# Patient Record
Sex: Female | Born: 1955 | Race: White | Hispanic: No | Marital: Married | Smoking: Never smoker
Health system: Southern US, Community
[De-identification: ages and names within clinical notes are randomized; demographics above are authoritative.]

## PROBLEM LIST (undated history)

## (undated) HISTORY — PX: OTHER SURGICAL HISTORY: SHX169

## (undated) HISTORY — PX: TONSILLECTOMY: SUR1361

---

## 1997-12-12 ENCOUNTER — Ambulatory Visit (HOSPITAL_COMMUNITY): Admission: RE | Admit: 1997-12-12 | Discharge: 1997-12-12 | Payer: Self-pay | Admitting: Obstetrics & Gynecology

## 1997-12-12 ENCOUNTER — Other Ambulatory Visit: Admission: RE | Admit: 1997-12-12 | Discharge: 1997-12-12 | Payer: Self-pay | Admitting: Obstetrics & Gynecology

## 1998-12-18 ENCOUNTER — Other Ambulatory Visit: Admission: RE | Admit: 1998-12-18 | Discharge: 1998-12-18 | Payer: Self-pay | Admitting: Obstetrics & Gynecology

## 2000-05-18 ENCOUNTER — Other Ambulatory Visit: Admission: RE | Admit: 2000-05-18 | Discharge: 2000-05-18 | Payer: Self-pay | Admitting: Obstetrics and Gynecology

## 2001-06-08 ENCOUNTER — Other Ambulatory Visit: Admission: RE | Admit: 2001-06-08 | Discharge: 2001-06-08 | Payer: Self-pay | Admitting: Obstetrics and Gynecology

## 2002-06-09 ENCOUNTER — Other Ambulatory Visit: Admission: RE | Admit: 2002-06-09 | Discharge: 2002-06-09 | Payer: Self-pay | Admitting: Obstetrics and Gynecology

## 2003-06-28 ENCOUNTER — Other Ambulatory Visit: Admission: RE | Admit: 2003-06-28 | Discharge: 2003-06-28 | Payer: Self-pay | Admitting: Obstetrics and Gynecology

## 2004-08-01 ENCOUNTER — Other Ambulatory Visit: Admission: RE | Admit: 2004-08-01 | Discharge: 2004-08-01 | Payer: Self-pay | Admitting: Obstetrics and Gynecology

## 2005-09-08 ENCOUNTER — Other Ambulatory Visit: Admission: RE | Admit: 2005-09-08 | Discharge: 2005-09-08 | Payer: Self-pay | Admitting: Obstetrics and Gynecology

## 2008-03-07 HISTORY — PX: COLONOSCOPY: SHX174

## 2008-03-21 ENCOUNTER — Ambulatory Visit: Payer: Self-pay | Admitting: Internal Medicine

## 2008-04-03 ENCOUNTER — Ambulatory Visit: Payer: Self-pay | Admitting: Internal Medicine

## 2015-11-22 ENCOUNTER — Other Ambulatory Visit: Payer: Self-pay | Admitting: Obstetrics and Gynecology

## 2015-11-22 DIAGNOSIS — R928 Other abnormal and inconclusive findings on diagnostic imaging of breast: Secondary | ICD-10-CM

## 2015-11-23 ENCOUNTER — Ambulatory Visit
Admission: RE | Admit: 2015-11-23 | Discharge: 2015-11-23 | Disposition: A | Discharge status: Home / Self Care | Payer: Managed Care, Other (non HMO) | Source: Ambulatory Visit | Attending: Obstetrics and Gynecology | Admitting: Obstetrics and Gynecology

## 2015-11-23 DIAGNOSIS — R928 Other abnormal and inconclusive findings on diagnostic imaging of breast: Secondary | ICD-10-CM

## 2015-11-23 IMAGING — MG MM DIAG BREAST TOMO UNI LEFT
6 series · 6 of 14 positions shown · non-contrast
Comparison: Previous exam(s).

CLINICAL DATA: Patient called back from screening for possible left
breast distortion.

EXAM:
2D DIGITAL DIAGNOSTIC UNILATERAL LEFT MAMMOGRAM WITH CAD AND ADJUNCT
TOMO

[L CC synth-2D]
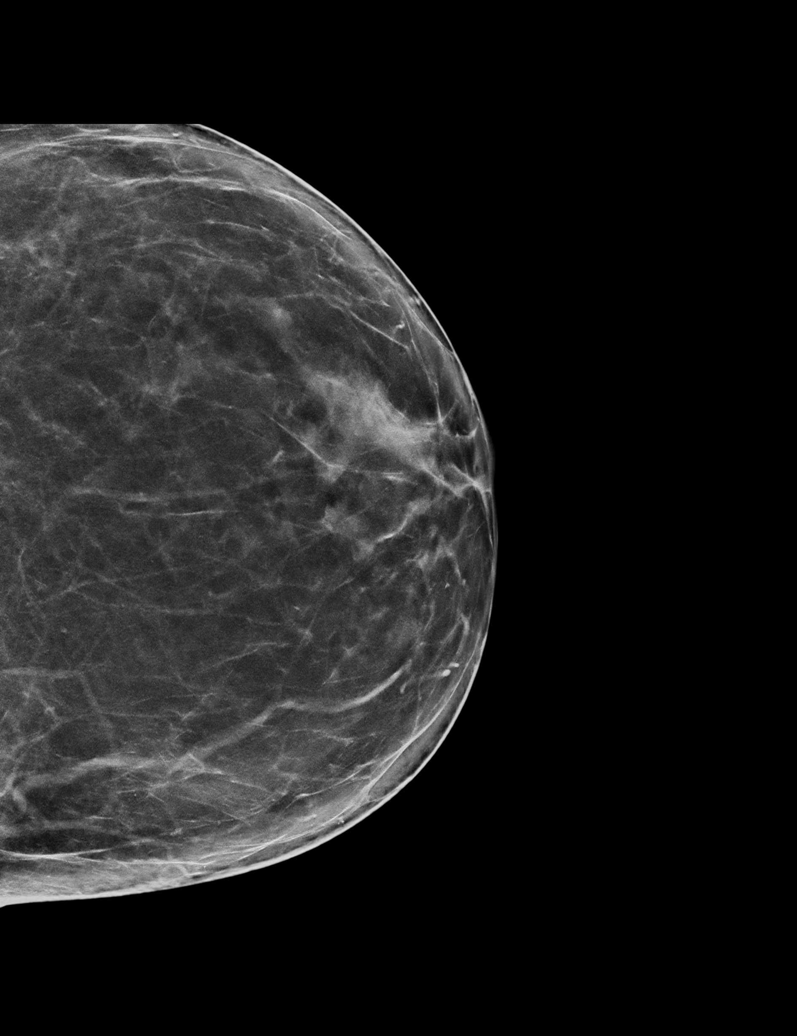

[L CC]
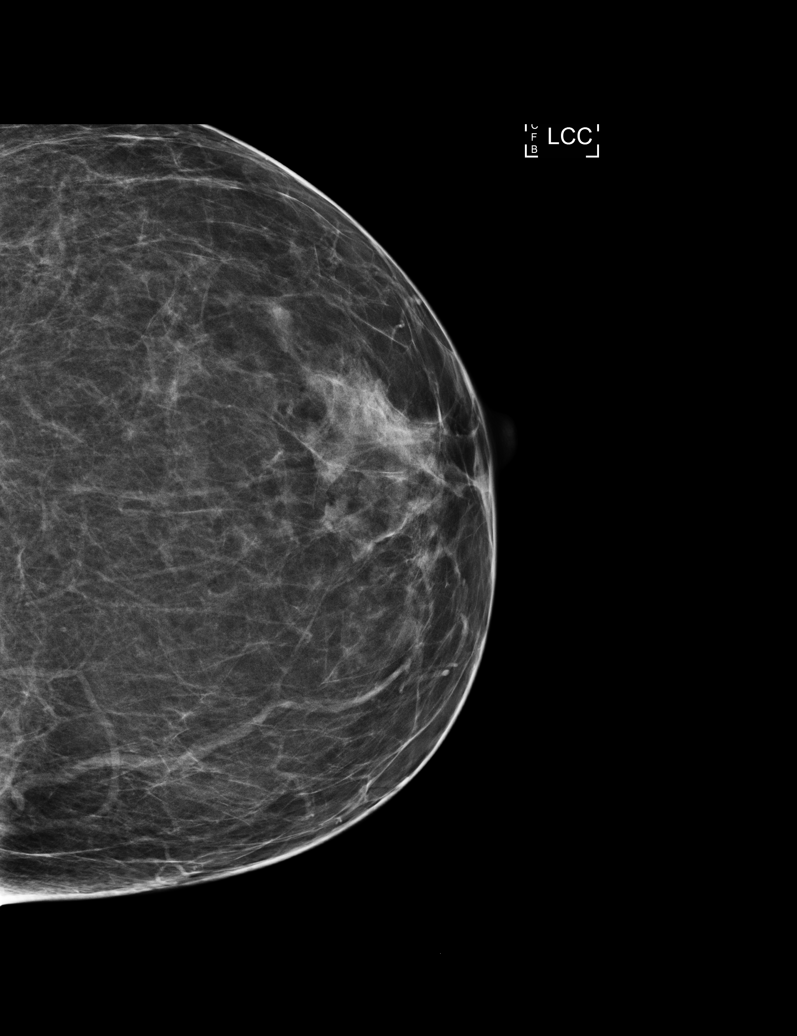

[L MLO]
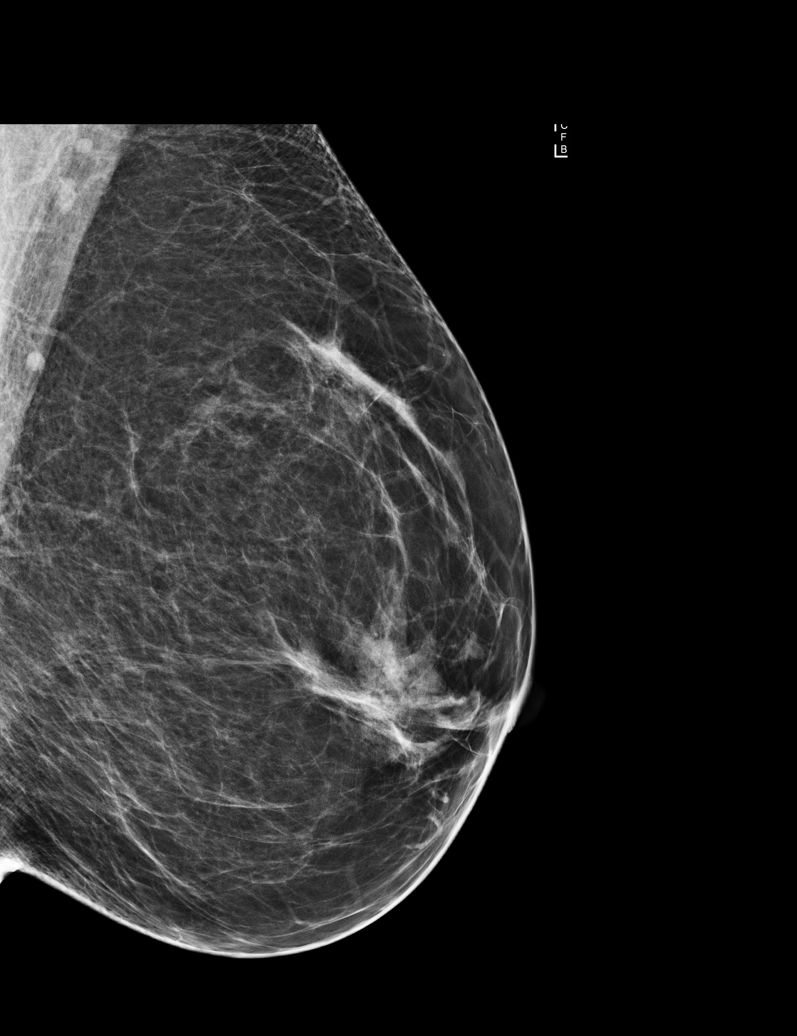

[L MLO synth-2D]
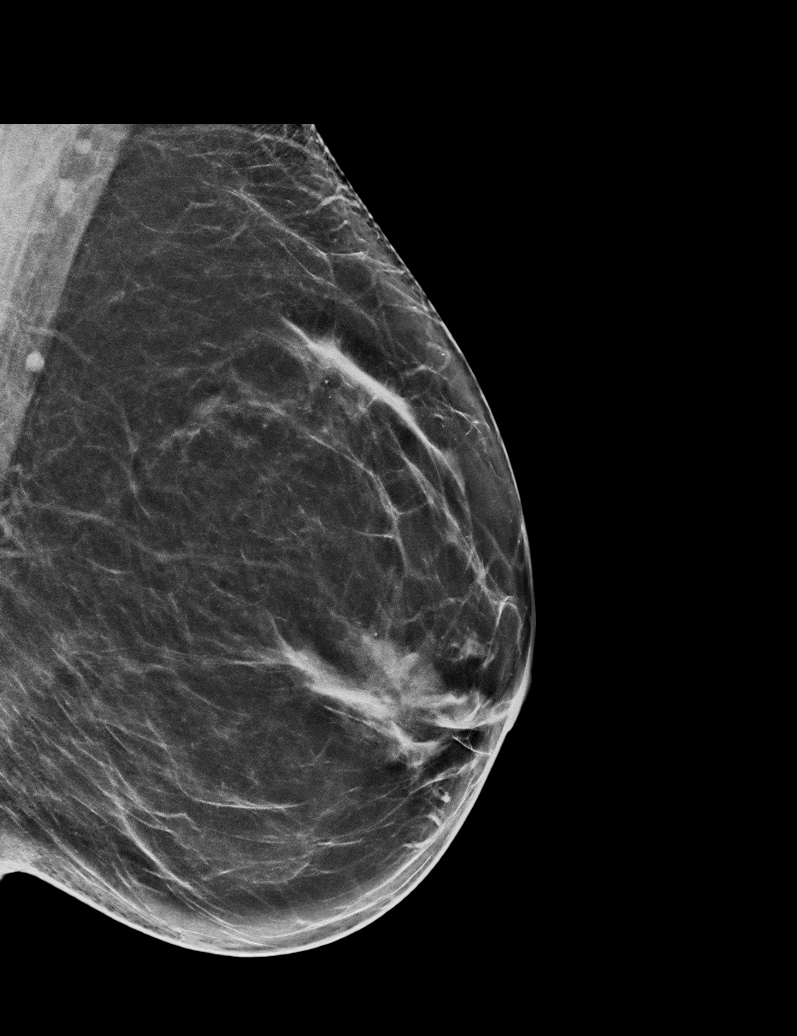

[L MLO tomo · tomo slice 37/73.0]
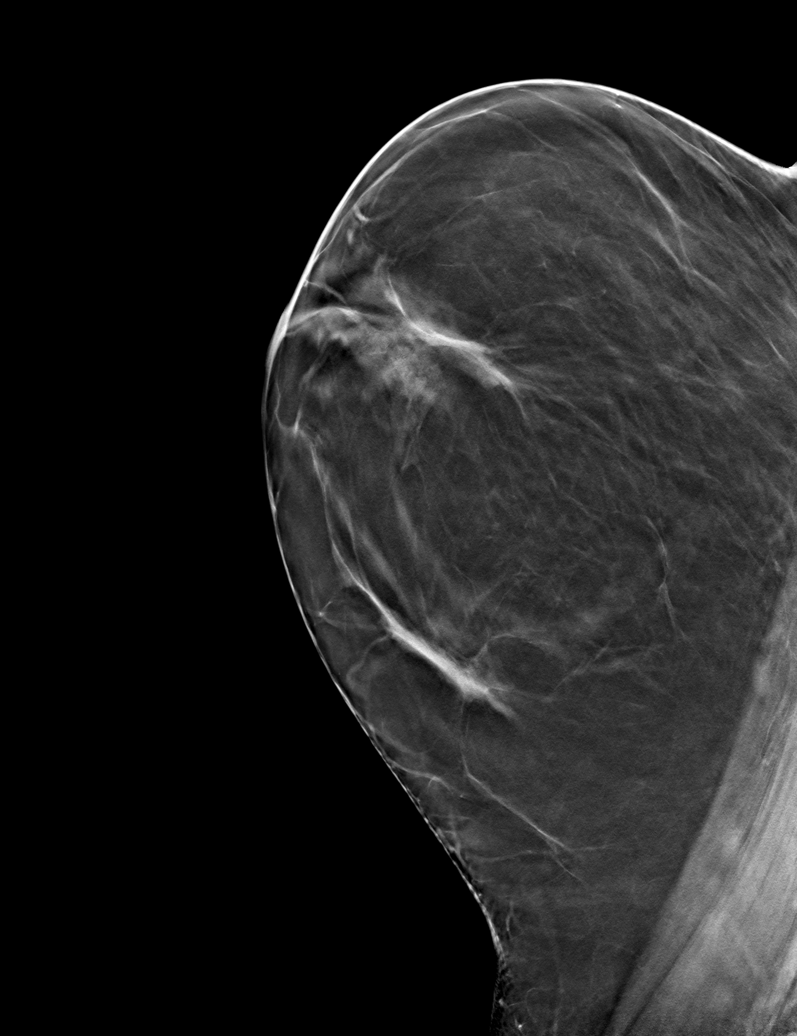

[L CC tomo · tomo slice 36/71.0]
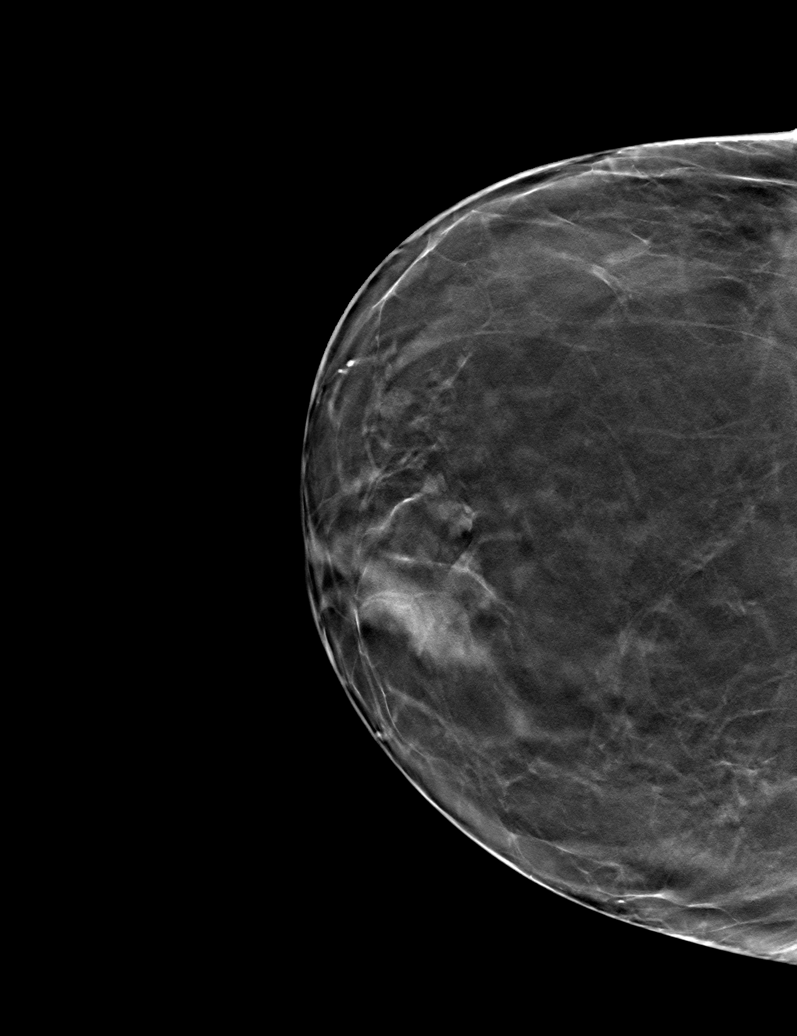

[6 of 14 positions shown; findings below may reference images not displayed]

ACR Breast Density Category c: The breast tissue is heterogeneously
dense, which may obscure small masses.
FINDINGS: CC and MLO tomosynthesis images of the left breast were obtained.
The questioned distortion within the upper-outer left breast
resolved with additional imaging, compatible with overlapping
fibroglandular breast tissue.

Mammographic images were processed with CAD.
IMPRESSION: No mammographic evidence for malignancy.

RECOMMENDATION:
Screening mammogram in one year.(Code:[IP])

I have discussed the findings and recommendations with the patient.
Results were also provided in writing at the conclusion of the
visit. If applicable, a reminder letter will be sent to the patient
regarding the next appointment.

BI-RADS CATEGORY  1: Negative.

## 2015-11-26 ENCOUNTER — Other Ambulatory Visit: Payer: Self-pay

## 2017-12-16 ENCOUNTER — Other Ambulatory Visit: Payer: Self-pay

## 2017-12-16 DIAGNOSIS — Z13220 Encounter for screening for lipoid disorders: Secondary | ICD-10-CM

## 2017-12-16 DIAGNOSIS — Z1321 Encounter for screening for nutritional disorder: Secondary | ICD-10-CM

## 2017-12-16 DIAGNOSIS — Z0000 Encounter for general adult medical examination without abnormal findings: Secondary | ICD-10-CM

## 2017-12-16 DIAGNOSIS — Z1329 Encounter for screening for other suspected endocrine disorder: Secondary | ICD-10-CM

## 2018-01-04 ENCOUNTER — Other Ambulatory Visit: Payer: 59 | Admitting: Internal Medicine

## 2018-01-04 DIAGNOSIS — Z0000 Encounter for general adult medical examination without abnormal findings: Secondary | ICD-10-CM

## 2018-01-04 DIAGNOSIS — Z1321 Encounter for screening for nutritional disorder: Secondary | ICD-10-CM

## 2018-01-04 DIAGNOSIS — Z13220 Encounter for screening for lipoid disorders: Secondary | ICD-10-CM

## 2018-01-04 DIAGNOSIS — Z1329 Encounter for screening for other suspected endocrine disorder: Secondary | ICD-10-CM

## 2018-01-05 LAB — COMPLETE METABOLIC PANEL WITH GFR
AG RATIO: 2.2 (calc) (ref 1.0–2.5)
ALT: 10 U/L (ref 6–29)
AST: 18 U/L (ref 10–35)
Albumin: 4.1 g/dL (ref 3.6–5.1)
Alkaline phosphatase (APISO): 55 U/L (ref 33–130)
BUN: 11 mg/dL (ref 7–25)
CALCIUM: 9.2 mg/dL (ref 8.6–10.4)
CO2: 29 mmol/L (ref 20–32)
CREATININE: 0.79 mg/dL (ref 0.50–0.99)
Chloride: 107 mmol/L (ref 98–110)
GFR, EST NON AFRICAN AMERICAN: 80 mL/min/{1.73_m2} (ref >=60)
GFR, Est African American: 93 mL/min/{1.73_m2} (ref >=60)
GLUCOSE: 85 mg/dL (ref 65–99)
Globulin: 1.9 g/dL (calc) (ref 1.9–3.7)
POTASSIUM: 4.2 mmol/L (ref 3.5–5.3)
Sodium: 142 mmol/L (ref 135–146)
Total Bilirubin: 0.5 mg/dL (ref 0.2–1.2)
Total Protein: 6 g/dL — ABNORMAL LOW (ref 6.1–8.1)

## 2018-01-05 LAB — CBC WITH DIFFERENTIAL/PLATELET
BASOS PCT: 0.8 %
Basophils Absolute: 29 cells/uL (ref 0–200)
EOS ABS: 79 {cells}/uL (ref 15–500)
Eosinophils Relative: 2.2 %
HCT: 35.6 % (ref 35.0–45.0)
HEMOGLOBIN: 11.3 g/dL — AB (ref 11.7–15.5)
Lymphs Abs: 1148 cells/uL (ref 850–3900)
MCH: 26.5 pg — AB (ref 27.0–33.0)
MCHC: 31.7 g/dL — ABNORMAL LOW (ref 32.0–36.0)
MCV: 83.6 fL (ref 80.0–100.0)
MONOS PCT: 10.3 %
MPV: 10.7 fL (ref 7.5–12.5)
NEUTROS ABS: 1973 {cells}/uL (ref 1500–7800)
Neutrophils Relative %: 54.8 %
Platelets: 264 10*3/uL (ref 140–400)
RBC: 4.26 10*6/uL (ref 3.80–5.10)
RDW: 13.8 % (ref 11.0–15.0)
Total Lymphocyte: 31.9 %
WBC mixed population: 371 cells/uL (ref 200–950)
WBC: 3.6 10*3/uL — ABNORMAL LOW (ref 3.8–10.8)

## 2018-01-05 LAB — LIPID PANEL
CHOL/HDL RATIO: 2.5 (calc) (ref <5.0)
Cholesterol: 186 mg/dL (ref <200)
HDL: 75 mg/dL (ref >50)
LDL Cholesterol (Calc): 97 mg/dL (calc)
NON-HDL CHOLESTEROL (CALC): 111 mg/dL (ref <130)
Triglycerides: 55 mg/dL (ref <150)

## 2018-01-05 LAB — TSH: TSH: 3.24 m[IU]/L (ref 0.40–4.50)

## 2018-01-05 LAB — VITAMIN D 25 HYDROXY (VIT D DEFICIENCY, FRACTURES): Vit D, 25-Hydroxy: 33 ng/mL (ref 30–100)

## 2018-01-12 ENCOUNTER — Encounter: Payer: Self-pay | Admitting: Internal Medicine

## 2018-01-12 ENCOUNTER — Ambulatory Visit (INDEPENDENT_AMBULATORY_CARE_PROVIDER_SITE_OTHER): Payer: 59 | Admitting: Internal Medicine

## 2018-01-12 VITALS — BP 102/80 | HR 62 | Ht 64.5 in | Wt 141.0 lb

## 2018-01-12 DIAGNOSIS — Z87898 Personal history of other specified conditions: Secondary | ICD-10-CM | POA: Diagnosis not present

## 2018-01-12 DIAGNOSIS — F439 Reaction to severe stress, unspecified: Secondary | ICD-10-CM

## 2018-01-12 DIAGNOSIS — Z85828 Personal history of other malignant neoplasm of skin: Secondary | ICD-10-CM | POA: Diagnosis not present

## 2018-01-12 DIAGNOSIS — Z0000 Encounter for general adult medical examination without abnormal findings: Secondary | ICD-10-CM

## 2018-01-12 LAB — POCT URINALYSIS DIPSTICK
Appearance: NORMAL
Bilirubin, UA: NEGATIVE
Blood, UA: NEGATIVE
Glucose, UA: NEGATIVE
KETONES UA: NEGATIVE
LEUKOCYTES UA: NEGATIVE
NITRITE UA: NEGATIVE
ODOR: NORMAL
PROTEIN UA: NEGATIVE
Spec Grav, UA: 1.01 (ref 1.010–1.025)
UROBILINOGEN UA: 0.2 U/dL
pH, UA: 7.5 (ref 5.0–8.0)

## 2018-01-12 NOTE — Progress Notes (Addendum)
Subjective:    Patient ID: Teresa Simmons, female    DOB: 1955/09/17, 62 y.o.   MRN: [id number]  HPI   62 year old Female referred by Lavera Guise presents to the office for the first time today.  GYN physician Is [facility].   Had mammogram January 02, 2017 as well as Pap smear.  Had DEXA scan there Nov 16, 2015 showing mild osteopenia.  Past medical history: Tonsillectomy at age 54  No history of operations  No accidents  No known drug allergies.  2 pregnancies and no miscarriages.  Medications include Caltrate daily and multivitamin with iron.  Social history: She and her husband have lived here for 23 years.  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  He is employed in Occupational psychologist.  She completed 4 years of college.  Does not smoke.  Social alcohol consumption.  Currently her father who is 30 years old is living with her.  He has had a brain malignancy and is developed some dementia.  Patient exercises several times a week by walking and playing pickle ball.  Family history: Father with history of brain cancer and memory loss.  Mother died at age 43 of lung cancer and was a smoker.  2 brothers one age 53 who is recovering alcoholic and another one age 40 who apparently is healthy as far she knows.  One sister age 65 healthy as far she knows.  2 adult sons ages 11 and 25 healthy.  Review of old records from Dr. Virgina Jock indicate patient has had a TSH hovering between 3.5 and 4.7.  History of basal cell carcinoma left cheek status post Mohs surgery and followed by Dr. Ubaldo Glassing.  History of Raynaud's disease.  Had Tdap immunization 2010, pneumococcal 23 October 17, 2008 and herpes zoster October 17, 2008.  Mammogram done by GYN office.  Pap in 2018 was normal.    Review of Systems insomnia, anxiety depression     Objective:   Physical Exam  Constitutional: She is oriented to person, place, and time. She appears well-developed and  well-nourished. No distress.  HENT:  Head: Normocephalic and atraumatic.  Right Ear: External ear normal.  Left Ear: External ear normal.  Mouth/Throat: Oropharynx is clear and moist.  Eyes: Pupils are equal, round, and reactive to light. EOM are normal.  Neck: No JVD present. No thyromegaly present.  Cardiovascular: Normal rate, regular rhythm and normal heart sounds.  No murmur heard. Pulmonary/Chest: Effort normal. No stridor. No respiratory distress. She has no wheezes.  Breasts normal female  Abdominal: Soft. She exhibits no distension and no mass. There is no tenderness. There is no rebound and no guarding. No hernia.  Lymphadenopathy:    She has no cervical adenopathy.  Neurological: She is alert and oriented to person, place, and time. She displays normal reflexes. No cranial nerve deficit. She exhibits normal muscle tone. Coordination normal.  Skin: Skin is warm and dry. She is not diaphoretic. No erythema. No pallor.  Psychiatric: She has a normal mood and affect. Her behavior is normal. Judgment and thought content normal.  Vitals reviewed.         Assessment & Plan:  History of TSH in the 3-4 range.  TSH recently drawn here was 3.24 and will be monitored.  Lipids are normal.  CBC shows white blood cell count 3600, hemoglobin 11.3 g and platelet count 264,000.  CBC done by Dr. Virgina Jock in 2012 shows white blood  cell count 3300, hemoglobin 12.4 g and platelet count 240,000.  She has not been seen by Dr. Virgina Jock since that time.  GYN results only show hemoglobin.  Suggest we recheck this in 6 months.  Situational stress with father living with her  whose health has deteriorated  Anxiety depression and insomnia-Per old records but currently not on medication  History of basal cell carcinoma followed by Dr. Ubaldo Glassing  do not do CBCs only hemoglobins.

## 2018-01-30 DIAGNOSIS — F439 Reaction to severe stress, unspecified: Secondary | ICD-10-CM | POA: Insufficient documentation

## 2018-01-30 DIAGNOSIS — Z85828 Personal history of other malignant neoplasm of skin: Secondary | ICD-10-CM | POA: Insufficient documentation

## 2018-01-30 DIAGNOSIS — Z87898 Personal history of other specified conditions: Secondary | ICD-10-CM | POA: Insufficient documentation

## 2018-01-30 NOTE — Patient Instructions (Addendum)
It was a pleasure to see you today.  Suggest repeating CBC in 6 months.  Continue to see GYN physician and have annual mammogram.  Recommend annual flu vaccine.

## 2018-02-12 ENCOUNTER — Encounter: Payer: Self-pay | Admitting: Gastroenterology

## 2018-02-15 ENCOUNTER — Encounter: Payer: Self-pay | Admitting: Gastroenterology

## 2018-03-30 ENCOUNTER — Encounter: Payer: Managed Care, Other (non HMO) | Admitting: Gastroenterology

## 2018-04-09 ENCOUNTER — Other Ambulatory Visit: Payer: Self-pay | Admitting: Physician Assistant

## 2018-04-09 ENCOUNTER — Ambulatory Visit
Admission: RE | Admit: 2018-04-09 | Discharge: 2018-04-09 | Disposition: A | Discharge status: Home / Self Care | Payer: 59 | Source: Ambulatory Visit | Attending: Physician Assistant | Admitting: Physician Assistant

## 2018-04-09 DIAGNOSIS — M25551 Pain in right hip: Secondary | ICD-10-CM

## 2018-04-09 MED ORDER — IOPAMIDOL (ISOVUE-300) INJECTION 61%
100.0000 mL | Freq: Once | INTRAVENOUS | Status: AC | PRN
  Administered 2018-04-09: 100 mL via INTRAVENOUS

## 2018-04-12 ENCOUNTER — Encounter: Payer: Managed Care, Other (non HMO) | Admitting: Gastroenterology

## 2019-04-06 ENCOUNTER — Encounter: Payer: Self-pay | Admitting: Internal Medicine

## 2019-04-06 ENCOUNTER — Telehealth: Payer: Self-pay | Admitting: Internal Medicine

## 2019-04-06 NOTE — Telephone Encounter (Signed)
Says Dr. Helane Rima did lipid panel and TSH with some abnormalitites. Tsh was elevated in 6 range and was to have started thyroid replacement which is reasonable. I have not seen these results from that office. They are supposed to be faxed here. Concern about lipids as well. Suggest OV for discussion. Has not been here since July 2019.

## 2019-04-06 NOTE — Telephone Encounter (Signed)
Pt called and said that she had lab work done at Dr Christen Butter office and that they sent over the lab work. She had a question regarding her labs and would like a call back

## 2019-08-29 ENCOUNTER — Other Ambulatory Visit: Payer: 59 | Admitting: Internal Medicine

## 2019-08-29 ENCOUNTER — Other Ambulatory Visit: Payer: Self-pay

## 2019-08-29 DIAGNOSIS — Z13220 Encounter for screening for lipoid disorders: Secondary | ICD-10-CM

## 2019-08-29 DIAGNOSIS — Z1329 Encounter for screening for other suspected endocrine disorder: Secondary | ICD-10-CM

## 2019-08-29 DIAGNOSIS — Z1321 Encounter for screening for nutritional disorder: Secondary | ICD-10-CM

## 2019-08-29 DIAGNOSIS — Z0000 Encounter for general adult medical examination without abnormal findings: Secondary | ICD-10-CM

## 2019-08-30 ENCOUNTER — Other Ambulatory Visit: Payer: 59 | Admitting: Internal Medicine

## 2019-08-30 LAB — COMPLETE METABOLIC PANEL WITH GFR
AG Ratio: 2.1 (calc) (ref 1.0–2.5)
ALT: 11 U/L (ref 6–29)
AST: 18 U/L (ref 10–35)
Albumin: 4.2 g/dL (ref 3.6–5.1)
Alkaline phosphatase (APISO): 57 U/L (ref 37–153)
BUN: 17 mg/dL (ref 7–25)
CO2: 30 mmol/L (ref 20–32)
Calcium: 9.6 mg/dL (ref 8.6–10.4)
Chloride: 103 mmol/L (ref 98–110)
Creat: 0.73 mg/dL (ref 0.50–0.99)
GFR, Est African American: 101 mL/min/{1.73_m2} (ref >=60)
GFR, Est Non African American: 87 mL/min/{1.73_m2} (ref >=60)
Globulin: 2 g/dL (calc) (ref 1.9–3.7)
Glucose, Bld: 82 mg/dL (ref 65–99)
Potassium: 4.6 mmol/L (ref 3.5–5.3)
Sodium: 140 mmol/L (ref 135–146)
Total Bilirubin: 0.7 mg/dL (ref 0.2–1.2)
Total Protein: 6.2 g/dL (ref 6.1–8.1)

## 2019-08-30 LAB — CBC WITH DIFFERENTIAL/PLATELET
Absolute Monocytes: 375 cells/uL (ref 200–950)
Basophils Absolute: 21 cells/uL (ref 0–200)
Basophils Relative: 0.6 %
Eosinophils Absolute: 102 cells/uL (ref 15–500)
Eosinophils Relative: 2.9 %
HCT: 40.2 % (ref 35.0–45.0)
Hemoglobin: 13.1 g/dL (ref 11.7–15.5)
Lymphs Abs: 1281 cells/uL (ref 850–3900)
MCH: 30.2 pg (ref 27.0–33.0)
MCHC: 32.6 g/dL (ref 32.0–36.0)
MCV: 92.6 fL (ref 80.0–100.0)
MPV: 11.1 fL (ref 7.5–12.5)
Monocytes Relative: 10.7 %
Neutro Abs: 1722 cells/uL (ref 1500–7800)
Neutrophils Relative %: 49.2 %
Platelets: 225 10*3/uL (ref 140–400)
RBC: 4.34 10*6/uL (ref 3.80–5.10)
RDW: 12.8 % (ref 11.0–15.0)
Total Lymphocyte: 36.6 %
WBC: 3.5 10*3/uL — ABNORMAL LOW (ref 3.8–10.8)

## 2019-08-30 LAB — LIPID PANEL
Cholesterol: 215 mg/dL — ABNORMAL HIGH (ref <200)
HDL: 75 mg/dL (ref >=50)
LDL Cholesterol (Calc): 124 mg/dL (calc) — ABNORMAL HIGH
Non-HDL Cholesterol (Calc): 140 mg/dL (calc) — ABNORMAL HIGH (ref <130)
Total CHOL/HDL Ratio: 2.9 (calc) (ref <5.0)
Triglycerides: 69 mg/dL (ref <150)

## 2019-08-30 LAB — VITAMIN D 25 HYDROXY (VIT D DEFICIENCY, FRACTURES): Vit D, 25-Hydroxy: 27 ng/mL — ABNORMAL LOW (ref 30–100)

## 2019-08-30 LAB — TSH: TSH: 3.36 mIU/L (ref 0.40–4.50)

## 2019-09-02 ENCOUNTER — Ambulatory Visit: Payer: 59 | Admitting: Internal Medicine

## 2019-09-02 ENCOUNTER — Encounter: Payer: Self-pay | Admitting: Internal Medicine

## 2019-09-02 ENCOUNTER — Other Ambulatory Visit: Payer: Self-pay

## 2019-09-02 VITALS — BP 110/80 | HR 91 | Temp 98.0°F | Ht 64.0 in | Wt 143.0 lb

## 2019-09-02 DIAGNOSIS — E7800 Pure hypercholesterolemia, unspecified: Secondary | ICD-10-CM | POA: Diagnosis not present

## 2019-09-02 DIAGNOSIS — Z0000 Encounter for general adult medical examination without abnormal findings: Secondary | ICD-10-CM | POA: Diagnosis not present

## 2019-09-02 DIAGNOSIS — E559 Vitamin D deficiency, unspecified: Secondary | ICD-10-CM

## 2019-09-02 LAB — POCT URINALYSIS DIPSTICK
Appearance: NEGATIVE
Bilirubin, UA: NEGATIVE
Blood, UA: NEGATIVE
Glucose, UA: NEGATIVE
Ketones, UA: NEGATIVE
Leukocytes, UA: NEGATIVE
Nitrite, UA: NEGATIVE
Odor: NEGATIVE
Protein, UA: NEGATIVE
Spec Grav, UA: 1.01 (ref 1.010–1.025)
Urobilinogen, UA: 0.2 E.U./dL
pH, UA: 6.5 (ref 5.0–8.0)

## 2019-09-02 MED ORDER — ERGOCALCIFEROL 1.25 MG (50000 UT) PO CAPS: 50000.0000 [IU] | ORAL_CAPSULE | ORAL | 0 refills | Status: DC

## 2019-09-02 NOTE — Patient Instructions (Addendum)
Take high dose Vitamin D weekly for 12 weeks then nature made 4000 units D3 daily. Try FD Guard for gas and bloating. RTC in opne year or as needed.

## 2019-09-02 NOTE — Progress Notes (Signed)
Subjective:    Patient ID: Teresa Simmons, female    DOB: 1955/07/13, 64 y.o.   MRN: [id number]  HPI Pleasant 64 year old Female seen for health maintenance exam and evaluation of medical issues.  Patient was referred by Lavera Guise.  [facility] provides her GYN care.  No history of operations accidents and no known drug allergies.  Past medical history: Tonsillectomy at age 26.  Had DEXA scan at GYN office May 2017 showing mild osteopenia.  2 pregnancies and no miscarriages.  Takes over-the-counter Caltrate and multivitamin with iron.  Formerly seen by Dr. Virgina Jock and apparently has history of TSH hovering between 3.5 and 4.7.  History of basal cell carcinoma left cheek status post Mohs surgery and followed by Dr. Ubaldo Glassing.  History of Raynaud's disease.  Mammogram done by GYN office.  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  Husband is employed in Occupational psychologist.  She completed 4 years of college.  Does not smoke.  Social alcohol consumption.  Patient exercises several times weekly.  Family history: Father with history of brain cancer and memory loss.  Mother died at age 92 of lung cancer and was a smoker.  2 brothers, 1 brother who is recovering alcoholic and another brother is healthy as far she knows.  1 sister is healthy as far she knows.  2 adult sons who are healthy.    Review of Systems  Respiratory: Negative.   Cardiovascular: Negative.   Gastrointestinal: Negative.   Genitourinary: Negative.   Musculoskeletal: Negative.   Neurological: Negative.        Objective:   Physical Exam Vitals reviewed.  Constitutional:      Appearance: Normal appearance.  HENT:     Head: Normocephalic and atraumatic.     Right Ear: Tympanic membrane normal.     Left Ear: Tympanic membrane normal.     Nose: Nose normal.  Eyes:     General: No scleral icterus.       Right eye: No discharge.        Left eye: No discharge.   Extraocular Movements: Extraocular movements intact.     Conjunctiva/sclera: Conjunctivae normal.     Pupils: Pupils are equal, round, and reactive to light.  Neck:     Vascular: No carotid bruit.  Cardiovascular:     Rate and Rhythm: Normal rate and regular rhythm.     Pulses: Normal pulses.     Heart sounds: Normal heart sounds. No murmur.  Pulmonary:     Effort: No respiratory distress.     Breath sounds: Normal breath sounds. No wheezing.  Abdominal:     General: Bowel sounds are normal.     Palpations: Abdomen is soft. There is no mass.     Tenderness: There is no abdominal tenderness.  Genitourinary:    Comments: Deferred to GYN Musculoskeletal:     Cervical back: Neck supple. No rigidity.     Right lower leg: No edema.     Left lower leg: No edema.  Lymphadenopathy:     Cervical: No cervical adenopathy.  Skin:    General: Skin is warm and dry.  Neurological:     General: No focal deficit present.     Mental Status: She is alert and oriented to person, place, and time.     Cranial Nerves: No cranial nerve deficit.  Psychiatric:        Mood and Affect: Mood normal.  Behavior: Behavior normal.        Judgment: Judgment normal.           Assessment & Plan:  Vitamin D deficiency-vitamin D level is low at 27.  Recommend 50,000 units weekly for 12 weeks then 4000 units daily.  History of fluctuating TSH levels.  TSH 1 year ago was 3.24 and now is 3.36.  Continue to monitor.  Pure hypercholesterolemia.  Total cholesterol was 215 and LDL cholesterol was 124.  HDL is 75 and triglycerides normal at 69.  Continue diet and exercise efforts.  History of white blood cell count 3600 a year ago and is now 3500.  Continue to monitor.  Hemoglobin is stable at 13.1 g, MCV is normal and platelet count is normal.  Likely a benign neutropenia.  Complaint of abdominal bloating and gas-try FD Guard  Plan: Recommend return in 1 year or as needed.  Has had 1 Covid vaccine with  Moderna on January 30.  Has had Shingrix vaccinations and had annual flu vaccine in the Fall 2020.

## 2019-09-02 NOTE — Progress Notes (Deleted)
   Subjective:    Patient ID: Teresa Simmons, female    DOB: 1955/09/24, 64 y.o.   MRN: [id number]  HPI    Review of Systems     Objective:   Physical Exam        Assessment & Plan:

## 2019-09-24 DIAGNOSIS — E559 Vitamin D deficiency, unspecified: Secondary | ICD-10-CM | POA: Insufficient documentation

## 2019-09-24 DIAGNOSIS — E7800 Pure hypercholesterolemia, unspecified: Secondary | ICD-10-CM | POA: Insufficient documentation

## 2020-12-04 DIAGNOSIS — Z85828 Personal history of other malignant neoplasm of skin: Secondary | ICD-10-CM | POA: Diagnosis not present

## 2020-12-04 DIAGNOSIS — I951 Orthostatic hypotension: Secondary | ICD-10-CM | POA: Diagnosis not present

## 2020-12-04 DIAGNOSIS — Z8249 Family history of ischemic heart disease and other diseases of the circulatory system: Secondary | ICD-10-CM | POA: Diagnosis not present

## 2021-01-14 DIAGNOSIS — R69 Illness, unspecified: Secondary | ICD-10-CM | POA: Diagnosis not present

## 2021-01-14 DIAGNOSIS — Z638 Other specified problems related to primary support group: Secondary | ICD-10-CM | POA: Diagnosis not present

## 2021-01-28 DIAGNOSIS — R69 Illness, unspecified: Secondary | ICD-10-CM | POA: Diagnosis not present

## 2021-01-28 DIAGNOSIS — Z638 Other specified problems related to primary support group: Secondary | ICD-10-CM | POA: Diagnosis not present

## 2021-03-18 DIAGNOSIS — Z638 Other specified problems related to primary support group: Secondary | ICD-10-CM | POA: Diagnosis not present

## 2021-03-18 DIAGNOSIS — R69 Illness, unspecified: Secondary | ICD-10-CM | POA: Diagnosis not present

## 2021-03-18 DIAGNOSIS — F419 Anxiety disorder, unspecified: Secondary | ICD-10-CM | POA: Diagnosis not present

## 2021-04-15 DIAGNOSIS — F419 Anxiety disorder, unspecified: Secondary | ICD-10-CM | POA: Diagnosis not present

## 2021-04-15 DIAGNOSIS — Z638 Other specified problems related to primary support group: Secondary | ICD-10-CM | POA: Diagnosis not present

## 2021-04-15 DIAGNOSIS — R69 Illness, unspecified: Secondary | ICD-10-CM | POA: Diagnosis not present

## 2021-05-07 ENCOUNTER — Other Ambulatory Visit: Payer: Self-pay

## 2021-05-07 ENCOUNTER — Other Ambulatory Visit: Payer: Medicare HMO | Admitting: Internal Medicine

## 2021-05-07 DIAGNOSIS — E559 Vitamin D deficiency, unspecified: Secondary | ICD-10-CM

## 2021-05-07 DIAGNOSIS — Z0000 Encounter for general adult medical examination without abnormal findings: Secondary | ICD-10-CM

## 2021-05-07 DIAGNOSIS — R6889 Other general symptoms and signs: Secondary | ICD-10-CM | POA: Diagnosis not present

## 2021-05-07 DIAGNOSIS — E7800 Pure hypercholesterolemia, unspecified: Secondary | ICD-10-CM | POA: Diagnosis not present

## 2021-05-07 DIAGNOSIS — D649 Anemia, unspecified: Secondary | ICD-10-CM

## 2021-05-07 DIAGNOSIS — D619 Aplastic anemia, unspecified: Secondary | ICD-10-CM | POA: Diagnosis not present

## 2021-05-08 DIAGNOSIS — D649 Anemia, unspecified: Secondary | ICD-10-CM | POA: Insufficient documentation

## 2021-05-08 NOTE — Addendum Note (Signed)
Addended by: Angus Seller on: 05/08/2021 12:51 PM   Modules accepted: Orders

## 2021-05-10 ENCOUNTER — Other Ambulatory Visit: Payer: Self-pay

## 2021-05-10 ENCOUNTER — Encounter: Payer: Self-pay | Admitting: Internal Medicine

## 2021-05-10 ENCOUNTER — Ambulatory Visit (INDEPENDENT_AMBULATORY_CARE_PROVIDER_SITE_OTHER): Payer: Medicare HMO | Admitting: Internal Medicine

## 2021-05-10 ENCOUNTER — Telehealth: Payer: Self-pay | Admitting: Internal Medicine

## 2021-05-10 VITALS — BP 124/72 | HR 76 | Temp 98.1°F | Ht 64.25 in | Wt 138.5 lb

## 2021-05-10 DIAGNOSIS — Z1211 Encounter for screening for malignant neoplasm of colon: Secondary | ICD-10-CM

## 2021-05-10 DIAGNOSIS — R7989 Other specified abnormal findings of blood chemistry: Secondary | ICD-10-CM | POA: Diagnosis not present

## 2021-05-10 DIAGNOSIS — E611 Iron deficiency: Secondary | ICD-10-CM

## 2021-05-10 DIAGNOSIS — Z0000 Encounter for general adult medical examination without abnormal findings: Secondary | ICD-10-CM | POA: Diagnosis not present

## 2021-05-10 DIAGNOSIS — E7800 Pure hypercholesterolemia, unspecified: Secondary | ICD-10-CM | POA: Diagnosis not present

## 2021-05-10 LAB — CBC WITH DIFFERENTIAL/PLATELET
Absolute Monocytes: 392 cells/uL (ref 200–950)
Basophils Absolute: 20 cells/uL (ref 0–200)
Basophils Relative: 0.5 %
Eosinophils Absolute: 92 cells/uL (ref 15–500)
Eosinophils Relative: 2.3 %
HCT: 37.4 % (ref 35.0–45.0)
Hemoglobin: 11.5 g/dL — ABNORMAL LOW (ref 11.7–15.5)
Lymphs Abs: 1456 cells/uL (ref 850–3900)
MCH: 26.8 pg — ABNORMAL LOW (ref 27.0–33.0)
MCHC: 30.7 g/dL — ABNORMAL LOW (ref 32.0–36.0)
MCV: 87.2 fL (ref 80.0–100.0)
MPV: 11.1 fL (ref 7.5–12.5)
Monocytes Relative: 9.8 %
Neutro Abs: 2040 cells/uL (ref 1500–7800)
Neutrophils Relative %: 51 %
Platelets: 250 10*3/uL (ref 140–400)
RBC: 4.29 10*6/uL (ref 3.80–5.10)
RDW: 13.5 % (ref 11.0–15.0)
Total Lymphocyte: 36.4 %
WBC: 4 10*3/uL (ref 3.8–10.8)

## 2021-05-10 LAB — POCT URINALYSIS DIPSTICK
Bilirubin, UA: NEGATIVE
Blood, UA: NEGATIVE
Glucose, UA: NEGATIVE
Ketones, UA: NEGATIVE
Leukocytes, UA: NEGATIVE
Nitrite, UA: NEGATIVE
Protein, UA: NEGATIVE
Spec Grav, UA: 1.015 (ref 1.010–1.025)
Urobilinogen, UA: 0.2 E.U./dL
pH, UA: 5 (ref 5.0–8.0)

## 2021-05-10 LAB — LIPID PANEL
Cholesterol: 196 mg/dL (ref <200)
HDL: 78 mg/dL (ref >=50)
LDL Cholesterol (Calc): 104 mg/dL (calc) — ABNORMAL HIGH
Non-HDL Cholesterol (Calc): 118 mg/dL (calc) (ref <130)
Total CHOL/HDL Ratio: 2.5 (calc) (ref <5.0)
Triglycerides: 56 mg/dL (ref <150)

## 2021-05-10 LAB — TSH: TSH: 4.73 mIU/L — ABNORMAL HIGH (ref 0.40–4.50)

## 2021-05-10 LAB — IRON,TIBC AND FERRITIN PANEL
%SAT: 15 % (calc) — ABNORMAL LOW (ref 16–45)
Ferritin: 6 ng/mL — ABNORMAL LOW (ref 16–288)
Iron: 58 ug/dL (ref 45–160)
TIBC: 400 mcg/dL (calc) (ref 250–450)

## 2021-05-10 LAB — VITAMIN D 25 HYDROXY (VIT D DEFICIENCY, FRACTURES): Vit D, 25-Hydroxy: 84 ng/mL (ref 30–100)

## 2021-05-10 LAB — FOLATE: Folate: 10 ng/mL

## 2021-05-10 LAB — VITAMIN B12: Vitamin B-12: 347 pg/mL (ref 200–1100)

## 2021-05-10 NOTE — Progress Notes (Signed)
Subjective:    Patient ID: Teresa Simmons, female    DOB: Sep 17, 1955, 65 y.o.   MRN: [id number]  HPI 65 year old  seen for health maintenance exam, Medicare wellness exam, and evaluation of medical issues.  She was referred by Lavera Guise to the office and was seen for the first time in February 2021.  She now has Medicare coverage and this is her Welcome to Medicare physical exam.  She had pneumococcal vaccine in April 2022.  Has had Shingrix vaccines.  Had 1 Moderna vaccine according to our records in January 2021.  No history of  accidents or serious illnesses.  No known drug allergies.  Had tonsillectomy at age 55.  Had DEXA scan in GYN office May 2017 showing mild osteopenia.  Mammogram done in GYN office.  Formerly seen by Dr. Virgina Jock and apparently had history of TSH hovering between 3.5 and 4.7.  Currently TSH is 4.73.  It would appear she has mild hypothyroidism.  Thyroid replacement medication was discussed with her and this will be repeated in 3 months.  She also has mild iron deficiency.  Total iron was 58 normal being between 45 and 160.  Ferritin is low at 6%.  Iron binding capacity is normal at 400 and percent saturation slightly low at 15%.  Last colonoscopy was in 2009 and is past due.  This was done by Dr. Olevia Perches and was normal.  This will be readdressed in January.  She needs another colonoscopy however.  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  Husband is employed in Occupational psychologist.  She completed 4 years of college.  Does not smoke.  Social alcohol consumption.  She exercises several times weekly.  Family history: Father with history of brain cancer and memory loss.  Mother died at age 55 of lung cancer and was a smoker.  2 brothers, 1 who is a recovering alcoholic and another brother who is healthy as far she knows.  1 sister is healthy as far she knows.  2 adult sons  are healthy.  2 pregnancies and no  miscarriages.          Review of Systems Has GYN physician (Dr. Helane Rima) and will get mammogram through that office  Hx right hip pain- can take Ibuprofen for pain   Has had pneumococcal 23 in April 2022 and says she has had Flu vaccine.   Recommend Covid booster     Objective:   Physical Exam  Vital signs reviewed.  BMI 23.59.  Blood pressure 124/72 pulse 76 and regular.  Skin: Warm and dry.  TMs clear.  No cervical adenopathy or thyromegaly.  No carotid bruits.  Chest is clear.  Cardiac exam: Regular rate and rhythm.  Abdomen soft nondistended without hepatosplenomegaly masses or tenderness.  Pelvic exam deferred to GYN physician.  No lower extremity edema.  Brief neurological exam is intact without focal deficits.  Affect thought and judgment are normal.      Assessment & Plan:   Low ferritin c/w iron deficiency. Take iron supplement and Recheck in 3 months. Referral for colonoscopy.  Elevated TSH- repeat in 3 months. May be mildly hypothyroid.  Health maintenance: Recommend COVID booster.  Has had Shingrix vaccine.  Had pneumococcal 27 October 2020.  Suggest flu vaccine.  Will need tetanus immunization if she injures herself.  Medicare does not pay for it unless there is an injury.   Subjective:   Patient presents for Medicare Annual/Subsequent preventive examination.  Review  Past Medical/Family/Social: See above   Risk Factors  Current exercise habits: Regular exercise Dietary issues discussed: Low-fat low carbohydrate  Cardiac risk factors: Mild hyperlipidemia  Depression Screen  (Note: if answer to either of the following is "Yes", a more complete depression screening is indicated)   Over the past two weeks, have you felt down, depressed or hopeless? No  Over the past two weeks, have you felt little interest or pleasure in doing things? No Have you lost interest or pleasure in daily life? No Do you often feel hopeless? No Do you cry easily over simple  problems? No   Activities of Daily Living  In your present state of health, do you have any difficulty performing the following activities?:   Driving? No  Managing money? No  Feeding yourself? No  Getting from bed to chair? No  Climbing a flight of stairs? No  Preparing food and eating?: No  Bathing or showering? No  Getting dressed: No  Getting to the toilet? No  Using the toilet:No  Moving around from place to place: No   Are you sexually active? Not asked    Hearing Difficulties: No  Do you often ask people to speak up or repeat themselves? No  Do you experience ringing or noises in your ears? No  Do you have difficulty understanding soft or whispered voices? No  Do you feel that you have a problem with memory? No Do you often misplace items? No    Home Safety:  Do you have a smoke alarm at your residence? Yes    Cognitive Testing  Alert? Yes Normal Appearance?Yes  Oriented to person? Yes Place? Yes  Time? Yes  Recall of three objects? Yes  Can perform simple calculations? Yes  Displays appropriate judgment?Yes  Can read the correct time from a watch face?Yes   List the Names of Other Physician/Practitioners you currently use:  See referral list for the physicians patient is currently seeing.  [facility] does GYN care   Review of Systems: See above   Objective:     General appearance: Appears younger than stated age and trim Head: Normocephalic, without obvious abnormality, atraumatic  Eyes: conj clear, EOMi PEERLA  Ears: normal TM's and external ear canals both ears  Nose: Nares normal. Septum midline. Mucosa normal. No drainage or sinus tenderness.  Throat: lips, mucosa, and tongue normal; teeth and gums normal  Neck: no adenopathy, no carotid bruit, no JVD, supple, symmetrical, trachea midline and thyroid not enlarged, symmetric, no tenderness/mass/nodules  No CVA tenderness.  Lungs: clear to auscultation bilaterally  Heart: regular rate  and rhythm, S1, S2 normal, no murmur, click, rub or gallop  Abdomen: soft, non-tender; bowel sounds normal; no masses, no organomegaly  Musculoskeletal: ROM normal in all joints, no crepitus, no deformity, Normal muscle strengthen. Back  is symmetric, no curvature. Skin: Skin color, texture, turgor normal. No rashes or lesions  Lymph nodes: Cervical, supraclavicular, and axillary nodes normal.  Neurologic: CN 2 -12 Normal, Normal symmetric reflexes. Normal coordination and gait  Psych: Alert & Oriented x 3, Mood appear stable.    Assessment:    Annual wellness medicare exam   Plan:    During the course of the visit the patient was educated and counseled about appropriate screening and preventive services including:  Mammogram through GYN  Suggest colonoscopy  Vaccines discussed      Patient Instructions (the written plan) was given to the patient.  Medicare Attestation  I have personally reviewed:  The patient's medical and social history  Their use of alcohol, tobacco or illicit drugs  Their current medications and supplements  The patient's functional ability including ADLs,fall risks, home safety risks, cognitive, and hearing and visual impairment  Diet and physical activities  Evidence for depression or mood disorders  The patient's weight, height, BMI, and visual acuity have been recorded in the chart. I have made referrals, counseling, and provided education to the patient based on review of the above and I have provided the patient with a written personalized care plan for preventive services.

## 2021-05-10 NOTE — Patient Instructions (Addendum)
Referral for colonoscopy.  Follow up here in January. Take iron supplement for iron deficiency.  We will recheck TSH which is mildly elevated at that time as well.

## 2021-05-10 NOTE — Telephone Encounter (Signed)
Teresa Simmons is going to be going to her GYN on 07/17/2021, and she would like to have all of the labs drawn there. She wanted to know what labs you where going to want, I let her know I would have to ask you first to see if that would be okay. She would like to only have one blood draw, for now I have scheduled her for labs on 07/25/2021 and an office visit on 07/26/2021

## 2021-05-13 NOTE — Telephone Encounter (Signed)
I am sorry I should have been more specific in note, you have order these labs in January for a 3 month follow up, CBC, Total Iron Binding, Iron Ferritin, TSH, elevated, Iron Deficiency, she wants to get these done at her GYN, will that be okay, or does she need to come here for these labs?

## 2021-07-05 DIAGNOSIS — R7989 Other specified abnormal findings of blood chemistry: Secondary | ICD-10-CM | POA: Insufficient documentation

## 2021-07-05 DIAGNOSIS — E611 Iron deficiency: Secondary | ICD-10-CM | POA: Insufficient documentation

## 2021-07-12 NOTE — Telephone Encounter (Signed)
Back when patient was in for her CPE she did not want to have these labs done here, she wanted them done at GYN, after going back and forth we schedule her right before GYN appointment. She was told you did not want these labs done at another lab. Now she has canceled labs and appointment with below comments, wanting labs in February. Please advise. I was going to call to reschedule and let her know she will have to reschedule both appointments. Results of labs are not dicussed via mychart.      Patient Comments: This is a follow-up visit to a February blood draw to determine if my iron and TSH are in-line with desired norms.  I'd prefer to NOT come in but instead ask Dr. Renold Genta to communicate her thoughts about the February blood results via [facility].

## 2021-07-12 NOTE — Telephone Encounter (Signed)
Patient called back and scheduled appointments.

## 2021-07-17 DIAGNOSIS — Z6825 Body mass index (BMI) 25.0-25.9, adult: Secondary | ICD-10-CM | POA: Diagnosis not present

## 2021-07-17 DIAGNOSIS — Z124 Encounter for screening for malignant neoplasm of cervix: Secondary | ICD-10-CM | POA: Diagnosis not present

## 2021-07-17 DIAGNOSIS — Z1231 Encounter for screening mammogram for malignant neoplasm of breast: Secondary | ICD-10-CM | POA: Diagnosis not present

## 2021-07-25 ENCOUNTER — Other Ambulatory Visit: Payer: Medicare HMO | Admitting: Internal Medicine

## 2021-07-26 ENCOUNTER — Ambulatory Visit: Payer: Medicare HMO | Admitting: Internal Medicine

## 2021-07-29 ENCOUNTER — Encounter: Payer: Self-pay | Admitting: Gastroenterology

## 2021-07-29 DIAGNOSIS — H2513 Age-related nuclear cataract, bilateral: Secondary | ICD-10-CM | POA: Diagnosis not present

## 2021-08-22 ENCOUNTER — Other Ambulatory Visit: Payer: Self-pay

## 2021-08-22 ENCOUNTER — Other Ambulatory Visit: Payer: Medicare HMO | Admitting: Internal Medicine

## 2021-08-22 DIAGNOSIS — E611 Iron deficiency: Secondary | ICD-10-CM | POA: Diagnosis not present

## 2021-08-22 DIAGNOSIS — R7989 Other specified abnormal findings of blood chemistry: Secondary | ICD-10-CM

## 2021-08-22 DIAGNOSIS — R5383 Other fatigue: Secondary | ICD-10-CM | POA: Diagnosis not present

## 2021-08-23 LAB — CBC WITH DIFFERENTIAL/PLATELET
Absolute Monocytes: 376 cells/uL (ref 200–950)
Basophils Absolute: 20 cells/uL (ref 0–200)
Basophils Relative: 0.5 %
Eosinophils Absolute: 68 cells/uL (ref 15–500)
Eosinophils Relative: 1.7 %
HCT: 38.4 % (ref 35.0–45.0)
Hemoglobin: 11.8 g/dL (ref 11.7–15.5)
Lymphs Abs: 1400 cells/uL (ref 850–3900)
MCH: 26 pg — ABNORMAL LOW (ref 27.0–33.0)
MCHC: 30.7 g/dL — ABNORMAL LOW (ref 32.0–36.0)
MCV: 84.8 fL (ref 80.0–100.0)
MPV: 11.5 fL (ref 7.5–12.5)
Monocytes Relative: 9.4 %
Neutro Abs: 2136 cells/uL (ref 1500–7800)
Neutrophils Relative %: 53.4 %
Platelets: 226 10*3/uL (ref 140–400)
RBC: 4.53 10*6/uL (ref 3.80–5.10)
RDW: 17.3 % — ABNORMAL HIGH (ref 11.0–15.0)
Total Lymphocyte: 35 %
WBC: 4 10*3/uL (ref 3.8–10.8)

## 2021-08-23 LAB — IRON,TIBC AND FERRITIN PANEL
%SAT: 31 % (calc) (ref 16–45)
Ferritin: 9 ng/mL — ABNORMAL LOW (ref 16–288)
Iron: 117 ug/dL (ref 45–160)
TIBC: 381 mcg/dL (calc) (ref 250–450)

## 2021-08-23 LAB — TSH: TSH: 3.74 mIU/L (ref 0.40–4.50)

## 2021-08-29 ENCOUNTER — Other Ambulatory Visit: Payer: Self-pay

## 2021-08-29 ENCOUNTER — Ambulatory Visit (AMBULATORY_SURGERY_CENTER): Payer: Medicare HMO | Admitting: *Deleted

## 2021-08-29 ENCOUNTER — Ambulatory Visit: Payer: Medicare HMO | Admitting: Internal Medicine

## 2021-08-29 VITALS — Ht 65.0 in | Wt 136.0 lb

## 2021-08-29 DIAGNOSIS — Z1211 Encounter for screening for malignant neoplasm of colon: Secondary | ICD-10-CM

## 2021-08-29 MED ORDER — PLENVU 140 G PO SOLR: 1.0000 | Freq: Once | ORAL | 0 refills | Status: AC

## 2021-08-29 NOTE — Progress Notes (Signed)

## 2021-08-30 ENCOUNTER — Ambulatory Visit: Payer: Self-pay | Admitting: Internal Medicine

## 2021-09-06 ENCOUNTER — Encounter: Payer: Self-pay | Admitting: Gastroenterology

## 2021-09-12 ENCOUNTER — Other Ambulatory Visit: Payer: Self-pay

## 2021-09-12 ENCOUNTER — Encounter: Payer: Self-pay | Admitting: Gastroenterology

## 2021-09-12 ENCOUNTER — Ambulatory Visit (AMBULATORY_SURGERY_CENTER): Payer: Medicare HMO | Admitting: Gastroenterology

## 2021-09-12 VITALS — BP 109/66 | HR 65 | Temp 98.2°F | Resp 15 | Ht 65.0 in | Wt 136.0 lb

## 2021-09-12 DIAGNOSIS — Z1211 Encounter for screening for malignant neoplasm of colon: Secondary | ICD-10-CM | POA: Diagnosis not present

## 2021-09-12 DIAGNOSIS — D509 Iron deficiency anemia, unspecified: Secondary | ICD-10-CM | POA: Diagnosis not present

## 2021-09-12 MED ORDER — SODIUM CHLORIDE 0.9 % IV SOLN: 500.0000 mL | Freq: Once | INTRAVENOUS | Status: DC

## 2021-09-12 NOTE — Patient Instructions (Addendum)
Thank you for allowing [place] to care for you today. ?Resume previous diet and medications today. ?Return to normal daily activities tomorrow. ? ?Upper Endoscopy scheduled for Wednesday April 26 @ 9:30 am / PLEASE ARRIVE BY 8:30 AM ?Instructions provided in handout and sent to MyChart. ? ?YOU HAD AN ENDOSCOPIC PROCEDURE TODAY AT [facility]:   Refer to the procedure report that was given to you for any specific questions about what was found during the examination.  If the procedure report does not answer your questions, please call your gastroenterologist to clarify.  If you requested that your care partner not be given the details of your procedure findings, then the procedure report has been included in a sealed envelope for you to review at your convenience later. ? ?YOU SHOULD EXPECT: Some feelings of bloating in the abdomen. Passage of more gas than usual.  Walking can help get rid of the air that was put into your GI tract during the procedure and reduce the bloating. If you had a lower endoscopy (such as a colonoscopy or flexible sigmoidoscopy) you may notice spotting of blood in your stool or on the toilet paper. If you underwent a bowel prep for your procedure, you may not have a normal bowel movement for a few days. ? ?Please Note:  You might notice some irritation and congestion in your nose or some drainage.  This is from the oxygen used during your procedure.  There is no need for concern and it should clear up in a day or so. ? ?SYMPTOMS TO REPORT IMMEDIATELY: ? ?Following lower endoscopy (colonoscopy or flexible sigmoidoscopy): ? Excessive amounts of blood in the stool ? Significant tenderness or worsening of abdominal pains ? Swelling of the abdomen that is new, acute ? Fever of 100?F or higher ? ? ?For urgent or emergent issues, a gastroenterologist can be reached at any hour by calling [phone]. ?Do not use MyChart messaging for urgent concerns.  ? ? ?DIET:  We do recommend a  small meal at first, but then you may proceed to your regular diet.  Drink plenty of fluids but you should avoid alcoholic beverages for 24 hours. ? ?ACTIVITY:  You should plan to take it easy for the rest of today and you should NOT DRIVE or use heavy machinery until tomorrow (because of the sedation medicines used during the test).   ? ?FOLLOW UP: ?Our staff will call the number listed on your records 48-72 hours following your procedure to check on you and address any questions or concerns that you may have regarding the information given to you following your procedure. If we do not reach you, we will leave a message.  We will attempt to reach you two times.  During this call, we will ask if you have developed any symptoms of COVID 19. If you develop any symptoms (ie: fever, flu-like symptoms, shortness of breath, cough etc.) before then, please call [phone].  If you test positive for Covid 19 in the 2 weeks post procedure, please call and report this information to [place].   ? ?If any biopsies were taken you will be contacted by phone or by letter within the next 1-3 weeks.  Please call us at [phone] if you have not heard about the biopsies in 3 weeks.  ? ? ?SIGNATURES/CONFIDENTIALITY: ?You and/or your care partner have signed paperwork which will be entered into your electronic medical record.  These signatures attest to the fact that that the  information above on your After Visit Summary has been reviewed and is understood.  Full responsibility of the confidentiality of this discharge information lies with you and/or your care-partner.  ?

## 2021-09-12 NOTE — Progress Notes (Signed)
Patient scheduled for EGD 10/30/21. Instructions reviewed with patient and sent through [facility]. ?

## 2021-09-12 NOTE — Progress Notes (Signed)
VS-CW  Pt's states no medical or surgical changes since previsit or office visit.  

## 2021-09-12 NOTE — Progress Notes (Signed)
[facility] History and Physical ? ? ?Primary Care Physician:  Elby Showers, MD ? ? ?Reason for Procedure:   Screening / iron deficiency  ? ?Plan:    colonoscopy ? ? ? ? ?HPI: Teresa Simmons is a 66 y.o. female  here for colonoscopy surveillance. Patient denies any bowel symptoms at this time. No family history of colon cancer known. Otherwise feels well without any cardiopulmonary symptoms. Noted to have iron deficiency in recent months. ? ? ?History reviewed. No pertinent past medical history. ? ?Past Surgical History:  ?Procedure Laterality Date  ? COLONOSCOPY  03/2008  ? Dr.Brodie  ? TONSILLECTOMY    ? ? ?Prior to Admission medications   ?Medication Sig Start Date End Date Taking? Authorizing Provider  ?Multiple Vitamins-Minerals (CENTRUM SILVER PO) Take by mouth.   Yes [provider]  ?VITAMIN D PO Take 2 capsules by mouth daily.   Yes [provider]  ? ? ?Current Outpatient Medications  ?Medication Sig Dispense Refill  ? Multiple Vitamins-Minerals (CENTRUM SILVER PO) Take by mouth.    ? VITAMIN D PO Take 2 capsules by mouth daily.    ? ?Current Facility-Administered Medications  ?Medication Dose Route Frequency Provider Last Rate Last Admin  ? 0.9 %  sodium chloride infusion  500 mL Intravenous Once Takumi Din, Carlota Raspberry, MD      ? ? ?Allergies as of 09/12/2021 - Review Complete 09/12/2021  ?Allergen Reaction Noted  ? Other  09/02/2019  ? Penicillins  03/21/2008  ? ? ?Family History  ?Problem Relation Age of Onset  ? Cancer Mother   ? Lung cancer Mother   ? Cancer Father   ? Prostate cancer Father   ? Asthma Brother   ? Colon cancer Neg Hx   ? Colon polyps Neg Hx   ? Esophageal cancer Neg Hx   ? Stomach cancer Neg Hx   ? Rectal cancer Neg Hx   ? ? ?Social History  ? ?Socioeconomic History  ? Marital status: Married  ?  Spouse name: Not on file  ? Number of children: Not on file  ? Years of education: Not on file  ? Highest education level: Not on file  ?Occupational History  ?  Not on file  ?Tobacco Use  ? Smoking status: Never  ? Smokeless tobacco: Never  ?Vaping Use  ? Vaping Use: Never used  ?Substance and Sexual Activity  ? Alcohol use: Yes  ?  Alcohol/week: 2.0 standard drinks  ?  Types: 2 Glasses of wine per week  ? Drug use: Not Currently  ? Sexual activity: Not on file  ?Other Topics Concern  ? Not on file  ?Social History Narrative  ? Not on file  ? ?Social Determinants of Health  ? ?Financial Resource Strain: Not on file  ?Food Insecurity: Not on file  ?Transportation Needs: Not on file  ?Physical Activity: Not on file  ?Stress: Not on file  ?Social Connections: Not on file  ?Intimate Partner Violence: Not on file  ? ? ?Review of Systems: ?All other review of systems negative except as mentioned in the HPI. ? ?Physical Exam: ?Vital signs ?BP 131/79   Pulse 70   Temp 98.2 ?F (36.8 ?C) (Temporal)   Ht '5\' 5"'$  (1.651 m)   Wt 136 lb (61.7 kg)   SpO2 99%   BMI 22.63 kg/m?  ? ?General:   Alert,  Well-developed, pleasant and cooperative in NAD ?Lungs:  Clear throughout to auscultation.   ?Heart:  Regular rate  and rhythm ?Abdomen:  Soft, nontender and nondistended.   ?Neuro/Psych:  Alert and cooperative. Normal mood and affect. A and O x 3 ? ?Jolly Mango, MD ?[facility] ? ? ?

## 2021-09-12 NOTE — Progress Notes (Signed)
Sedate, gd SR, tolerated procedure well, VSS, report to RN 

## 2021-09-12 NOTE — Op Note (Signed)
[facility] ?Patient Name: Teresa Simmons ?Procedure Date: 09/12/2021 9:29 AM ?MRN: [id number] ?Endoscopist: Carlota Raspberry. Havery Moros , MD ?Age: 66 ?Referring MD:  ?Date of Birth: 11-01-1955 ?Gender: Female ?Account #: [id number] ?Procedure:                Colonoscopy ?Indications:              Iron deficiency anemia - last colonoscopy 2009 -  ?                          direct book for colonoscopy for iron deficiency,  ?                          was not seen in the office ?Medicines:                Monitored Anesthesia Care ?Procedure:                Pre-Anesthesia Assessment: ?                          - Prior to the procedure, a History and Physical  ?                          was performed, and patient medications and  ?                          allergies were reviewed. The patient's tolerance of  ?                          previous anesthesia was also reviewed. The risks  ?                          and benefits of the procedure and the sedation  ?                          options and risks were discussed with the patient.  ?                          All questions were answered, and informed consent  ?                          was obtained. Prior Anticoagulants: The patient has  ?                          taken no previous anticoagulant or antiplatelet  ?                          agents. ASA Grade Assessment: II - A patient with  ?                          mild systemic disease. After reviewing the risks  ?                          and benefits, the patient was deemed in  ?  satisfactory condition to undergo the procedure. ?                          After obtaining informed consent, the colonoscope  ?                          was passed under direct vision. Throughout the  ?                          procedure, the patient's blood pressure, pulse, and  ?                          oxygen saturations were monitored continuously. The  ?                          PCF-HQ190L Colonoscope was  introduced through the  ?                          anus and advanced to the the terminal ileum, with  ?                          identification of the appendiceal orifice and IC  ?                          valve. The colonoscopy was performed without  ?                          difficulty. The patient tolerated the procedure  ?                          well. The quality of the bowel preparation was  ?                          good. The terminal ileum, ileocecal valve,  ?                          appendiceal orifice, and rectum were photographed. ?Scope In: 9:46:52 AM ?Scope Out: 10:05:24 AM ?Scope Withdrawal Time: 0 hours 12 minutes 25 seconds  ?Total Procedure Duration: 0 hours 18 minutes 32 seconds  ?Findings:                 The perianal and digital rectal examinations were  ?                          normal. ?                          The terminal ileum appeared normal. ?                          The colon was extremely tortuous. ?                          The exam was otherwise without abnormality. ?Complications:            No immediate complications. Estimated  blood loss:  ?                          None. ?Estimated Blood Loss:     Estimated blood loss: none. ?Impression:               - The examined portion of the ileum was normal. ?                          - Tortuous colon. ?                          - The examination was otherwise normal. ?                          - No polyps. ?                          No cause for iron deficiency on this exam ?Recommendation:           - Patient has a contact number available for  ?                          emergencies. The signs and symptoms of potential  ?                          delayed complications were discussed with the  ?                          patient. Return to normal activities tomorrow.  ?                          Written discharge instructions were provided to the  ?                          patient. ?                          - Resume previous diet. ?                           - Continue present medications. ?                          - Repeat colonoscopy in 10 years for screening  ?                          purposes. ?                          - Recommend EGD to clear upper tract in light of  ?                          iron deficiency diagnosis, will discuss with the  ?                          patient ?Remo Lipps P. Havery Moros, MD ?09/12/2021 10:10:55 AM ?This report  has been signed electronically. ?

## 2021-09-13 ENCOUNTER — Other Ambulatory Visit: Payer: Self-pay

## 2021-09-13 ENCOUNTER — Telehealth: Payer: Self-pay

## 2021-09-13 ENCOUNTER — Encounter: Payer: Self-pay | Admitting: Internal Medicine

## 2021-09-13 DIAGNOSIS — D2262 Melanocytic nevi of left upper limb, including shoulder: Secondary | ICD-10-CM | POA: Diagnosis not present

## 2021-09-13 DIAGNOSIS — L905 Scar conditions and fibrosis of skin: Secondary | ICD-10-CM | POA: Diagnosis not present

## 2021-09-13 DIAGNOSIS — D509 Iron deficiency anemia, unspecified: Secondary | ICD-10-CM

## 2021-09-13 DIAGNOSIS — D2272 Melanocytic nevi of left lower limb, including hip: Secondary | ICD-10-CM | POA: Diagnosis not present

## 2021-09-13 DIAGNOSIS — D485 Neoplasm of uncertain behavior of skin: Secondary | ICD-10-CM | POA: Diagnosis not present

## 2021-09-13 DIAGNOSIS — L814 Other melanin hyperpigmentation: Secondary | ICD-10-CM | POA: Diagnosis not present

## 2021-09-13 DIAGNOSIS — Z85828 Personal history of other malignant neoplasm of skin: Secondary | ICD-10-CM | POA: Diagnosis not present

## 2021-09-13 DIAGNOSIS — C44519 Basal cell carcinoma of skin of other part of trunk: Secondary | ICD-10-CM | POA: Diagnosis not present

## 2021-09-13 DIAGNOSIS — L308 Other specified dermatitis: Secondary | ICD-10-CM | POA: Diagnosis not present

## 2021-09-13 DIAGNOSIS — D225 Melanocytic nevi of trunk: Secondary | ICD-10-CM | POA: Diagnosis not present

## 2021-09-13 DIAGNOSIS — D2261 Melanocytic nevi of right upper limb, including shoulder: Secondary | ICD-10-CM | POA: Diagnosis not present

## 2021-09-13 DIAGNOSIS — L820 Inflamed seborrheic keratosis: Secondary | ICD-10-CM | POA: Diagnosis not present

## 2021-09-13 NOTE — Telephone Encounter (Signed)
Per 09/12/21 procedure report - Recommend EGD to clear upper tract in light of iron deficiency diagnosis ? ?[facility] staff scheduled pt for EGD on 10/30/21 at 9:30 am. Instructions sent to pt yesterday. No ambulatory referral entered in epic.  ? ?I entered the ambulatory referral to obtain insurance authorization for EGD. ?

## 2021-09-16 ENCOUNTER — Telehealth: Payer: Self-pay

## 2021-09-16 NOTE — Telephone Encounter (Signed)
?  Follow up Call- ? ?Call back number 09/12/2021  ?Post procedure Call Back phone  # [phone]  ?Permission to leave phone message Yes  ?Some recent data might be hidden  ?  ? ?Patient questions: ? ?Do you have a fever, pain , or abdominal swelling? No. ?Pain Score  0 * ? ?Have you tolerated food without any problems? Yes.   ? ?Have you been able to return to your normal activities? Yes.   ? ?Do you have any questions about your discharge instructions: ?Diet   No. ?Medications  No. ?Follow up visit  No. ? ?Do you have questions or concerns about your Care? No. ? ?Actions: ?* If pain score is 4 or above: ?No action needed, pain <4. ? ? ?

## 2021-09-18 ENCOUNTER — Telehealth: Payer: Self-pay | Admitting: Internal Medicine

## 2021-09-18 NOTE — Telephone Encounter (Signed)
Called patient to schedule 3 month follow up that was due in January. She did come in February for her labs and seen the results in [place] and canceled her appointment with the Dr Renold Genta because she felt they were better and she has quit giving blood.  ?She is very busy at this time taking care of her elderly father and still working, she stated if she could do a phone visit she could do that, but she just does not have time to come into office at this time she knows her iron is low, but is improving. She had her colonoscopy last week and it was good. ?

## 2021-09-19 ENCOUNTER — Encounter: Payer: Self-pay | Admitting: Internal Medicine

## 2021-09-19 ENCOUNTER — Telehealth: Payer: Self-pay | Admitting: Internal Medicine

## 2021-09-19 NOTE — Telephone Encounter (Signed)
I called patient and she did not answer. Left voice mail message agreeing with upper endoscopy and stressing the importance of this procedure. I would like to follow up in 8 weeks IN my  office and stated that on the message. She will need non fasting anemia labs at that time. Can be done same day.MJB, MD ?

## 2021-10-30 ENCOUNTER — Encounter: Payer: Medicare HMO | Admitting: Gastroenterology

## 2021-11-26 ENCOUNTER — Telehealth: Payer: Self-pay | Admitting: Gastroenterology

## 2021-11-26 NOTE — Telephone Encounter (Signed)
Patient called to cancel endo for Thursday, 11/28/21.  She has some testing that needs to be done prior to having procedure.  She will call at a later date to reschedule.

## 2021-11-26 NOTE — Telephone Encounter (Signed)
This is unfortunate. Due to late notice of telling us she will need a cancellation / late fee.

## 2021-11-28 ENCOUNTER — Encounter: Payer: Medicare HMO | Admitting: Gastroenterology

## 2021-12-19 ENCOUNTER — Other Ambulatory Visit: Payer: Medicare HMO

## 2021-12-20 ENCOUNTER — Ambulatory Visit: Payer: Medicare HMO | Admitting: Internal Medicine

## 2021-12-30 ENCOUNTER — Other Ambulatory Visit: Payer: Medicare HMO

## 2021-12-30 DIAGNOSIS — E611 Iron deficiency: Secondary | ICD-10-CM

## 2021-12-30 LAB — CBC WITH DIFFERENTIAL/PLATELET
Eosinophils Relative: 2.2 %
Lymphs Abs: 1665 cells/uL (ref 850–3900)
MCH: 29.5 pg (ref 27.0–33.0)
Neutro Abs: 2291 cells/uL (ref 1500–7800)
Platelets: 216 10*3/uL (ref 140–400)
RBC: 4.68 10*6/uL (ref 3.80–5.10)

## 2021-12-30 LAB — IRON,TIBC AND FERRITIN PANEL
%SAT: 26 % (calc) (ref 16–45)
Iron: 88 ug/dL (ref 45–160)

## 2021-12-31 ENCOUNTER — Encounter: Payer: Self-pay | Admitting: Internal Medicine

## 2021-12-31 ENCOUNTER — Ambulatory Visit (INDEPENDENT_AMBULATORY_CARE_PROVIDER_SITE_OTHER): Payer: Medicare HMO | Admitting: Internal Medicine

## 2021-12-31 VITALS — BP 100/70 | HR 86 | Temp 98.4°F | Ht 65.0 in | Wt 140.5 lb

## 2021-12-31 DIAGNOSIS — E611 Iron deficiency: Secondary | ICD-10-CM

## 2021-12-31 LAB — CBC WITH DIFFERENTIAL/PLATELET
Absolute Monocytes: 428 cells/uL (ref 200–950)
Basophils Absolute: 18 cells/uL (ref 0–200)
Basophils Relative: 0.4 %
Eosinophils Absolute: 99 cells/uL (ref 15–500)
HCT: 42.8 % (ref 35.0–45.0)
Hemoglobin: 13.8 g/dL (ref 11.7–15.5)
MCHC: 32.2 g/dL (ref 32.0–36.0)
MCV: 91.5 fL (ref 80.0–100.0)
MPV: 10.7 fL (ref 7.5–12.5)
Monocytes Relative: 9.5 %
Neutrophils Relative %: 50.9 %
RDW: 14.3 % (ref 11.0–15.0)
Total Lymphocyte: 37 %
WBC: 4.5 10*3/uL (ref 3.8–10.8)

## 2021-12-31 LAB — IRON,TIBC AND FERRITIN PANEL
Ferritin: 26 ng/mL (ref 16–288)
TIBC: 344 mcg/dL (calc) (ref 250–450)

## 2021-12-31 NOTE — Progress Notes (Signed)
   Subjective:    Patient ID: Teresa Simmons, female    DOB: [birth year], 66 y.o.   MRN: [id number]  HPI  66 year old Female for follow up.  She had [facility] visit in November 2022.  Sees Dr. Helane Rima for GYN care.  Patient was diagnosed with iron deficiency in November 2022.  Her iron level was low normal at 58 and her ferritin was 6.  B12 level was 347 and folate level was 10.  She was advised to take an iron supplement and follow-up in 3 months as well as to have a colonoscopy.  She did have colonoscopy by Dr. Havery Moros in March 2023.  Colon was tortuous but no lesions were found to explain iron deficiency.  She is postmenopausal.  This is likely then due to decreased oral intake of iron.  Had repeat lab work here in February 2023 and serum iron had improved from 58 to 117.  Ferritin had improved from 6 to 9.  Review of Systems No new complaints.  Has no melena.  Currently taking multivitamin with minerals daily    Objective:   Physical Exam vital signs reviewed and are stable  She looks well and is in no acute distress.  No pallor.  Chest is clear to auscultation.  Cardiac exam: Regular rate and rhythm. Labs: Iron level is stable at 88 at the present time.  Ferritin is 26.  Discussion regarding lab studies and recommended treatment is 20 minutes     Assessment & Plan:   Iron deficiency currently treated with over-the-counter multivitamins iron/mineral supplement from [facility].  Iron deficiency has resolved.  Colonoscopy was normal.  She should continue with iron supplement indefinitely.  Her Medicare wellness and health maintenance exam scheduled for January 2024.

## 2022-01-19 NOTE — Patient Instructions (Addendum)
Continue with over-the-counter iron supplementation and follow-up in January 2024.  Colonoscopy was normal.

## 2022-05-14 DIAGNOSIS — L232 Allergic contact dermatitis due to cosmetics: Secondary | ICD-10-CM | POA: Diagnosis not present

## 2022-07-22 ENCOUNTER — Other Ambulatory Visit: Payer: Medicare HMO

## 2022-07-22 DIAGNOSIS — E7800 Pure hypercholesterolemia, unspecified: Secondary | ICD-10-CM | POA: Diagnosis not present

## 2022-07-22 DIAGNOSIS — R7989 Other specified abnormal findings of blood chemistry: Secondary | ICD-10-CM

## 2022-07-22 DIAGNOSIS — Z136 Encounter for screening for cardiovascular disorders: Secondary | ICD-10-CM

## 2022-07-23 LAB — LIPID PANEL
Cholesterol: 221 mg/dL — ABNORMAL HIGH (ref <200)
HDL: 80 mg/dL (ref >=50)
LDL Cholesterol (Calc): 125 mg/dL (calc) — ABNORMAL HIGH
Non-HDL Cholesterol (Calc): 141 mg/dL (calc) — ABNORMAL HIGH (ref <130)
Total CHOL/HDL Ratio: 2.8 (calc) (ref <5.0)
Triglycerides: 72 mg/dL (ref <150)

## 2022-07-23 LAB — COMPLETE METABOLIC PANEL WITH GFR
AG Ratio: 2.2 (calc) (ref 1.0–2.5)
ALT: 18 U/L (ref 6–29)
AST: 23 U/L (ref 10–35)
Albumin: 4.4 g/dL (ref 3.6–5.1)
Alkaline phosphatase (APISO): 69 U/L (ref 37–153)
BUN: 15 mg/dL (ref 7–25)
CO2: 31 mmol/L (ref 20–32)
Calcium: 10 mg/dL (ref 8.6–10.4)
Chloride: 105 mmol/L (ref 98–110)
Creat: 0.73 mg/dL (ref 0.50–1.05)
Globulin: 2 g/dL (calc) (ref 1.9–3.7)
Glucose, Bld: 82 mg/dL (ref 65–99)
Potassium: 5.2 mmol/L (ref 3.5–5.3)
Sodium: 141 mmol/L (ref 135–146)
Total Bilirubin: 0.6 mg/dL (ref 0.2–1.2)
Total Protein: 6.4 g/dL (ref 6.1–8.1)
eGFR: 90 mL/min/{1.73_m2} (ref >=60)

## 2022-07-23 LAB — CBC WITH DIFFERENTIAL/PLATELET
Absolute Monocytes: 294 cells/uL (ref 200–950)
Basophils Absolute: 32 cells/uL (ref 0–200)
Basophils Relative: 0.9 %
Eosinophils Absolute: 81 cells/uL (ref 15–500)
Eosinophils Relative: 2.3 %
HCT: 43.4 % (ref 35.0–45.0)
Hemoglobin: 14.2 g/dL (ref 11.7–15.5)
Lymphs Abs: 1243 cells/uL (ref 850–3900)
MCH: 31.2 pg (ref 27.0–33.0)
MCHC: 32.7 g/dL (ref 32.0–36.0)
MCV: 95.4 fL (ref 80.0–100.0)
MPV: 10.5 fL (ref 7.5–12.5)
Monocytes Relative: 8.4 %
Neutro Abs: 1852 cells/uL (ref 1500–7800)
Neutrophils Relative %: 52.9 %
Platelets: 213 10*3/uL (ref 140–400)
RBC: 4.55 10*6/uL (ref 3.80–5.10)
RDW: 11.9 % (ref 11.0–15.0)
Total Lymphocyte: 35.5 %
WBC: 3.5 10*3/uL — ABNORMAL LOW (ref 3.8–10.8)

## 2022-07-23 LAB — TSH: TSH: 5.24 mIU/L — ABNORMAL HIGH (ref 0.40–4.50)

## 2022-07-25 ENCOUNTER — Ambulatory Visit (INDEPENDENT_AMBULATORY_CARE_PROVIDER_SITE_OTHER): Payer: Medicare HMO | Admitting: Internal Medicine

## 2022-07-25 ENCOUNTER — Encounter: Payer: Self-pay | Admitting: Internal Medicine

## 2022-07-25 VITALS — BP 108/70 | HR 64 | Temp 98.8°F | Ht 64.5 in | Wt 137.0 lb

## 2022-07-25 DIAGNOSIS — R7989 Other specified abnormal findings of blood chemistry: Secondary | ICD-10-CM | POA: Diagnosis not present

## 2022-07-25 DIAGNOSIS — Z0000 Encounter for general adult medical examination without abnormal findings: Secondary | ICD-10-CM

## 2022-07-25 DIAGNOSIS — E7800 Pure hypercholesterolemia, unspecified: Secondary | ICD-10-CM | POA: Diagnosis not present

## 2022-07-25 DIAGNOSIS — E611 Iron deficiency: Secondary | ICD-10-CM | POA: Diagnosis not present

## 2022-07-25 LAB — POCT URINALYSIS DIPSTICK
Bilirubin, UA: NEGATIVE
Blood, UA: NEGATIVE
Glucose, UA: NEGATIVE
Ketones, UA: NEGATIVE
Leukocytes, UA: NEGATIVE
Nitrite, UA: NEGATIVE
Protein, UA: NEGATIVE
Spec Grav, UA: 1.015 (ref 1.010–1.025)
Urobilinogen, UA: 0.2 E.U./dL
pH, UA: 5 (ref 5.0–8.0)

## 2022-07-25 NOTE — Patient Instructions (Addendum)
Will be seeing GYN next week and will have mammogram there and bone density testing. Colonoscopy due 2028.RTC in 4 months for fasting lipid panel and TSH. Dx is elevated TSH and hyperlipidemia.

## 2022-07-25 NOTE — Progress Notes (Signed)
Annual Wellness Visit     Patient: Teresa Simmons, Female    DOB: 11/06/1955, 67 y.o.   MRN: [id number] Visit Date: 07/25/2022  Chief Complaint  Patient presents with   Medicare Wellness   Subjective    Teresa Simmons is a 67 y.o. female who presents today for her Annual Wellness Visit.  HPI She also presents for health maintenance exam and evaluation of medical issues.  She was referred by Lavera Guise to this office and was seen for the first time in February 2021.  Had welcome to Medicare exam in November 2022.  No history of accidents or serious illnesses.  No known drug allergies.  Had tonsillectomy at age 41.  History of DEXA scan in GYN office May 2017 showing mild osteopenia.  Has mammogram through GYN.  Previously seen by Dr. Virgina Jock and has had TSH values hovering between 3.5 and 4.7.  TSH associated with this visit is 5.24.  Continues to decline thyroid replacement medication.  History of mild iron deficiency.  Had colonoscopy by Dr. Maurene Capes in 2009 and had repeat study in 2023 which was normal.  Social history: She works for [employer] for [employer].  Her husband is employed in Occupational psychologist.  She completed 4 years of college.  Does not smoke.  Social alcohol consumption.  She exercises several times weekly.  2 pregnancies and no miscarriages.  Family history: Father with history of brain cancer memory loss.  Mother died at age 36 of lung cancer and was a smoker.  She has 2 brothers, 1 of whom is a recovering alcoholic and another brother who is healthy as far she knows.  1 sister is healthy as far she knows.  Patient has 2 adult sons that are healthy.      Patient Care Team: Elby Showers, MD as PCP - General (Internal Medicine)  Review of Systems no new complaints   Objective    Vitals: BP 108/70   Pulse 64   Temp 98.8 F (37.1 C) (Tympanic)   Ht 5' 4.5" (1.638 m)   Wt 137 lb (62.1 kg)   SpO2 97%   BMI 23.15 kg/m   Physical  Exam Skin: Warm and dry.  No cervical adenopathy.  No carotid bruits.  No thyromegaly.  Chest clear.  Cardiac exam: Regular rate and rhythm without murmur or ectopy.  Abdomen is soft, nondistended, without hepatosplenomegaly, masses or tenderness.  Pelvic exam deferred to gynecologist.  No lower extremity pitting edema.  Brief neurological exam: Intact without gross focal deficits.  Affect, thought, and judgment are normal.  Most recent functional status assessment:    07/25/2022    3:12 PM  In your present state of health, do you have any difficulty performing the following activities:  Hearing? 0  Vision? 0  Difficulty concentrating or making decisions? 0  Walking or climbing stairs? 0  Dressing or bathing? 0  Doing errands, shopping? 0  Preparing Food and eating ? N  Using the Toilet? N  In the past six months, have you accidently leaked urine? N  Do you have problems with loss of bowel control? N  Managing your Medications? N  Managing your Finances? N  Housekeeping or managing your Housekeeping? N   Most recent fall risk assessment:    07/25/2022    3:12 PM  Fall Risk   Falls in the past year? 0  Number falls in past yr: 0  Injury with Fall? 0  Risk  for fall due to : No Fall Risks  Follow up Falls prevention discussed    Most recent depression screenings:    07/25/2022    3:13 PM 07/25/2022    3:12 PM  PHQ 2/9 Scores  PHQ - 2 Score 0 0   Most recent cognitive screening:    07/25/2022    3:13 PM  6CIT Screen  What Year? 0 points  What month? 0 points  What time? 0 points  Count back from 20 0 points  Months in reverse 0 points  Repeat phrase 0 points  Total Score 0 points       Assessment & Plan  History of low ferritin in 2023 with normal iron and iron binding capacity.  Her MCV is normal.  Hemoglobin 14.2 g.  Colonoscopy was normal.  In June 2023, her ferritin improved and was normal at 26.  Ferritin previously had been 6 in November 2022 and 9 in February  2023.  Pure hypercholesterolemia, total cholesterol 221 and LDL cholesterol 125.  Patient declines statin medication  Elevated TSH-suspicious for mild hypothyroidism-patient prefers to monitor this  Immunizations discussed recommendations made.  Patient will consider these.  Plan: Patient agrees to return in April and she will have repeat TSH and fasting lipid panel as well as hemoglobin A1c.     Annual wellness visit done today including the all of the following: Reviewed patient's Family Medical History Reviewed and updated list of patient's medical providers Assessment of cognitive impairment was done Assessed patient's functional ability Established a written schedule for health screening [place] Completed and Reviewed  Discussed health benefits of physical activity, and encouraged her to engage in regular exercise appropriate for her age and condition.         IElby Showers, MD, have reviewed all documentation for this visit. The documentation on 09/04/22 for the exam, diagnosis, procedures, and orders are all accurate and complete.  LaVon Barron Alvine, CMA

## 2022-07-29 DIAGNOSIS — Z1231 Encounter for screening mammogram for malignant neoplasm of breast: Secondary | ICD-10-CM | POA: Diagnosis not present

## 2022-07-29 DIAGNOSIS — Z01419 Encounter for gynecological examination (general) (routine) without abnormal findings: Secondary | ICD-10-CM | POA: Diagnosis not present

## 2022-07-29 DIAGNOSIS — Z6822 Body mass index (BMI) 22.0-22.9, adult: Secondary | ICD-10-CM | POA: Diagnosis not present

## 2022-07-29 DIAGNOSIS — M8588 Other specified disorders of bone density and structure, other site: Secondary | ICD-10-CM | POA: Diagnosis not present

## 2022-09-04 ENCOUNTER — Encounter: Payer: Self-pay | Admitting: Internal Medicine

## 2022-10-29 DIAGNOSIS — L570 Actinic keratosis: Secondary | ICD-10-CM | POA: Diagnosis not present

## 2022-10-29 DIAGNOSIS — L821 Other seborrheic keratosis: Secondary | ICD-10-CM | POA: Diagnosis not present

## 2022-10-29 DIAGNOSIS — D2261 Melanocytic nevi of right upper limb, including shoulder: Secondary | ICD-10-CM | POA: Diagnosis not present

## 2022-10-29 DIAGNOSIS — Z85828 Personal history of other malignant neoplasm of skin: Secondary | ICD-10-CM | POA: Diagnosis not present

## 2022-10-29 DIAGNOSIS — D1801 Hemangioma of skin and subcutaneous tissue: Secondary | ICD-10-CM | POA: Diagnosis not present

## 2022-10-29 DIAGNOSIS — H02722 Madarosis of right lower eyelid and periocular area: Secondary | ICD-10-CM | POA: Diagnosis not present

## 2022-10-29 DIAGNOSIS — L814 Other melanin hyperpigmentation: Secondary | ICD-10-CM | POA: Diagnosis not present

## 2022-10-29 DIAGNOSIS — D225 Melanocytic nevi of trunk: Secondary | ICD-10-CM | POA: Diagnosis not present

## 2022-10-29 DIAGNOSIS — D485 Neoplasm of uncertain behavior of skin: Secondary | ICD-10-CM | POA: Diagnosis not present

## 2022-10-29 DIAGNOSIS — D3612 Benign neoplasm of peripheral nerves and autonomic nervous system, upper limb, including shoulder: Secondary | ICD-10-CM | POA: Diagnosis not present

## 2022-11-03 DIAGNOSIS — R69 Illness, unspecified: Secondary | ICD-10-CM | POA: Diagnosis not present

## 2022-11-19 DIAGNOSIS — H2513 Age-related nuclear cataract, bilateral: Secondary | ICD-10-CM | POA: Diagnosis not present

## 2022-12-02 ENCOUNTER — Other Ambulatory Visit: Payer: Medicare HMO

## 2022-12-03 NOTE — Progress Notes (Signed)
Patient Care Team: Margaree Mackintosh, MD as PCP - General (Internal Medicine)  Visit Date: 12/11/22  Subjective:    Patient ID: Teresa Simmons , Female   DOB: [birth year], 67 y.o.    MRN: [id number]   67 y.o. Female presents today for 4 month TSH, lipid panel check.  CHOL elevated at 211, LDL at 118. Family history of heart disease in father, paternal grandfather. HGBA1c stable at 5.5%. Trying to watch diet.  TSH is at 3.98 on 12/08/22, down from 5.24 on 07/22/22.Continue to monitor TSH as  may be developing primary hypothyroidism.   History reviewed. No pertinent past medical history.   Family History  Problem Relation Age of Onset   Cancer Mother    Lung cancer Mother    Cancer Father    Prostate cancer Father    Asthma Brother    Colon cancer Neg Hx    Colon polyps Neg Hx    Esophageal cancer Neg Hx    Stomach cancer Neg Hx    Rectal cancer Neg Hx     Had medicare wellness visit in mid January. TSH was elevated then at 5.24. had been 4.73 November 2022. Not on thyroid replacement medication. We will continue to follow TSH intermittently. Generally, her TSH is in the 3 range but I prefer it to be around 1.00  Hgb AIC excellent at 5.5%.  Total cholesterol is 211 and was 221 4 months ago. LDL improved from 125 to 118. Has excellent HDL of 78 and triglycerides have always been normal.     Review of Systems  Constitutional:  Negative for fever and malaise/fatigue.  HENT:  Negative for congestion.   Eyes:  Negative for blurred vision.  Respiratory:  Negative for cough and shortness of breath.   Cardiovascular:  Negative for chest pain, palpitations and leg swelling.  Gastrointestinal:  Negative for vomiting.  Musculoskeletal:  Negative for back pain.  Skin:  Negative for rash.  Neurological:  Negative for loss of consciousness and headaches.        Objective:   Vitals: BP 108/70   Pulse 60   Resp 16   Ht 5' 4.5" (1.638 m)   Wt 134 lb 1.9 oz (60.8 kg)   SpO2 98%    BMI 22.67 kg/m    Physical Exam Vitals and nursing note reviewed.  Constitutional:      General: She is not in acute distress.    Appearance: Normal appearance. She is not toxic-appearing.  HENT:     Head: Normocephalic and atraumatic.  Pulmonary:     Effort: Pulmonary effort is normal.  Skin:    General: Skin is warm and dry.  Neurological:     Mental Status: She is alert and oriented to person, place, and time. Mental status is at baseline.  Psychiatric:        Mood and Affect: Mood normal.        Behavior: Behavior normal.        Thought Content: Thought content normal.        Judgment: Judgment normal.       Results:   Studies obtained and personally reviewed by me:   Labs:       Component Value Date/Time   NA 141 07/22/2022 0934   K 5.2 07/22/2022 0934   CL 105 07/22/2022 0934   CO2 31 07/22/2022 0934   GLUCOSE 82 07/22/2022 0934   BUN 15 07/22/2022 0934   CREATININE 0.73 07/22/2022 0934  CALCIUM 10.0 07/22/2022 0934   PROT 6.4 07/22/2022 0934   AST 23 07/22/2022 0934   ALT 18 07/22/2022 0934   BILITOT 0.6 07/22/2022 0934   GFRNONAA 87 08/29/2019 1131   GFRAA 101 08/29/2019 1131     Lab Results  Component Value Date   WBC 3.5 (L) 07/22/2022   HGB 14.2 07/22/2022   HCT 43.4 07/22/2022   MCV 95.4 07/22/2022   PLT 213 07/22/2022    Lab Results  Component Value Date   CHOL 211 (H) 12/08/2022   HDL 78 12/08/2022   LDLCALC 118 (H) 12/08/2022   TRIG 62 12/08/2022   CHOLHDL 2.7 12/08/2022    Lab Results  Component Value Date   HGBA1C 5.5 12/08/2022     Lab Results  Component Value Date   TSH 3.98 12/08/2022      Assessment & Plan:   Elevated cholesterol: LDL slightly elevated at 118. Ordered CT coronary calcium score. Family hx of heart disease in Father.  This will help determine if she would benefit from statin therapy.  TSH has been intermittently elevated. Will continue to monitor as it is normal today.  CPE due January  2025.  I,Alexander Ruley,acting as a Neurosurgeon for Margaree Mackintosh, MD.,have documented all relevant documentation on the behalf of Margaree Mackintosh, MD,as directed by  Margaree Mackintosh, MD while in the presence of Margaree Mackintosh, MD.   I, Margaree Mackintosh, MD, have reviewed all documentation for this visit. The documentation on 12/11/22 for the exam, diagnosis, procedures, and orders are all accurate and complete.

## 2022-12-04 ENCOUNTER — Ambulatory Visit: Payer: Medicare HMO | Admitting: Internal Medicine

## 2022-12-08 ENCOUNTER — Other Ambulatory Visit: Payer: Medicare HMO

## 2022-12-08 DIAGNOSIS — R7302 Impaired glucose tolerance (oral): Secondary | ICD-10-CM | POA: Diagnosis not present

## 2022-12-08 DIAGNOSIS — R7989 Other specified abnormal findings of blood chemistry: Secondary | ICD-10-CM | POA: Diagnosis not present

## 2022-12-08 DIAGNOSIS — E7800 Pure hypercholesterolemia, unspecified: Secondary | ICD-10-CM

## 2022-12-09 LAB — LIPID PANEL
Cholesterol: 211 mg/dL — ABNORMAL HIGH (ref <200)
HDL: 78 mg/dL (ref >=50)
LDL Cholesterol (Calc): 118 mg/dL (calc) — ABNORMAL HIGH
Non-HDL Cholesterol (Calc): 133 mg/dL (calc) — ABNORMAL HIGH (ref <130)
Total CHOL/HDL Ratio: 2.7 (calc) (ref <5.0)
Triglycerides: 62 mg/dL (ref <150)

## 2022-12-09 LAB — HEMOGLOBIN A1C
Hgb A1c MFr Bld: 5.5 % of total Hgb (ref <5.7)
Mean Plasma Glucose: 111 mg/dL
eAG (mmol/L): 6.2 mmol/L

## 2022-12-09 LAB — TSH: TSH: 3.98 mIU/L (ref 0.40–4.50)

## 2022-12-11 ENCOUNTER — Encounter: Payer: Self-pay | Admitting: Internal Medicine

## 2022-12-11 ENCOUNTER — Ambulatory Visit (INDEPENDENT_AMBULATORY_CARE_PROVIDER_SITE_OTHER): Payer: Medicare HMO | Admitting: Internal Medicine

## 2022-12-11 VITALS — BP 108/70 | HR 60 | Resp 16 | Ht 64.5 in | Wt 134.1 lb

## 2022-12-11 DIAGNOSIS — E7800 Pure hypercholesterolemia, unspecified: Secondary | ICD-10-CM

## 2022-12-11 NOTE — Patient Instructions (Addendum)
Due for Tdap, pneumonia  20, and RSV vaccines.  These may be obtained at local pharmacy.  Coronary calcium score ordered at [facility].  Patient given phone number to call for appointment.  Medicare wellness and health maintenance exam due January 2025.  Continue to watch diet with elevated LDL at 118.  TSH is intermittently elevated and will continue to be monitored.  It was a pleasure to see you today.

## 2023-02-09 ENCOUNTER — Encounter (HOSPITAL_BASED_OUTPATIENT_CLINIC_OR_DEPARTMENT_OTHER): Payer: Self-pay

## 2023-02-09 ENCOUNTER — Ambulatory Visit (HOSPITAL_BASED_OUTPATIENT_CLINIC_OR_DEPARTMENT_OTHER)
Admission: RE | Admit: 2023-02-09 | Discharge: 2023-02-09 | Disposition: A | Discharge status: Home / Self Care | Payer: Medicare HMO | Source: Ambulatory Visit | Attending: Internal Medicine | Admitting: Internal Medicine

## 2023-02-09 DIAGNOSIS — E7800 Pure hypercholesterolemia, unspecified: Secondary | ICD-10-CM | POA: Insufficient documentation

## 2023-02-16 ENCOUNTER — Telehealth: Payer: Self-pay | Admitting: Internal Medicine

## 2023-02-16 NOTE — Telephone Encounter (Signed)
Called patient to schedule this appointment and she said you had left her a message the other day that every thing was fine. I let her know that was on 02/10/2023 and the test had been updated on the 02/14/2023 and you had sent em this message today to call and schedule an appointment to go over results and to discuss what to do next. She said she would look in mychart and call back if she feels she needs an appointment.

## 2023-02-16 NOTE — Telephone Encounter (Signed)
-----   Message from Margaree Mackintosh sent at 02/16/2023  9:41 AM EDT ----- Please schedule an appt to discuss these results and next steps

## 2023-02-16 NOTE — Progress Notes (Signed)
Called patient to schedule the appointment and she stated you had left her a message and she did not feel she needed an appointment. I explained to her that you had left the message on 02/10/2023 and that some results had been added on 02/14/2023 and you had sent me this message today. She stated she would go on her mychart and read and call me back if she feels she needs an appointment.

## 2023-02-17 NOTE — Progress Notes (Signed)
Appointment scheduled.

## 2023-02-17 NOTE — Telephone Encounter (Signed)
scheduled

## 2023-02-19 ENCOUNTER — Ambulatory Visit (INDEPENDENT_AMBULATORY_CARE_PROVIDER_SITE_OTHER): Payer: Medicare HMO | Admitting: Internal Medicine

## 2023-02-19 ENCOUNTER — Encounter: Payer: Self-pay | Admitting: Internal Medicine

## 2023-02-19 VITALS — BP 102/70 | HR 70 | Ht 64.5 in | Wt 136.0 lb

## 2023-02-19 DIAGNOSIS — K769 Liver disease, unspecified: Secondary | ICD-10-CM | POA: Diagnosis not present

## 2023-02-19 NOTE — Progress Notes (Addendum)
Patient Care Team: Margaree Mackintosh, MD as PCP - General (Internal Medicine)  Visit Date: 02/19/23  Subjective:    Patient ID: Teresa Simmons , Female   DOB: 1955/08/03, 67 y.o.    MRN: [id number]   67 y.o. Female presents today to discuss 02/09/23 CT coronary calcium score. Her coronary calcium score is 0. She is pleased about this. On CT chest she was found to have a 1.9 cm hypodense lesion in the right lobe of the liver. Ultrasound was recommended. No GI symptoms, no jaundice, no abdominal pain.   No past medical history on file.   Family History  Problem Relation Age of Onset   Cancer Mother    Lung cancer Mother    Cancer Father    Prostate cancer Father    Asthma Brother    Colon cancer Neg Hx    Colon polyps Neg Hx    Esophageal cancer Neg Hx    Stomach cancer Neg Hx    Rectal cancer Neg Hx     Social Hx: works for [employer] for [employer] Does not smoke. Social alcohol consumption. Married.     Review of Systems  Constitutional:  Negative for fever and malaise/fatigue.  HENT:  Negative for congestion.   Eyes:  Negative for blurred vision.  Respiratory:  Negative for cough and shortness of breath.   Cardiovascular:  Negative for chest pain, palpitations and leg swelling.  Gastrointestinal:  Negative for vomiting.  Musculoskeletal:  Negative for back pain.  Skin:  Negative for rash.  Neurological:  Negative for loss of consciousness and headaches.        Objective:   Vitals: BP 102/70   Pulse 70   Ht 5' 4.5" (1.638 m)   Wt 136 lb (61.7 kg)   SpO2 96%   BMI 22.98 kg/m    Physical Exam Vitals and nursing note reviewed.  Constitutional:      General: She is not in acute distress.    Appearance: Normal appearance. She is not toxic-appearing.  HENT:     Head: Normocephalic and atraumatic.  Pulmonary:     Effort: Pulmonary effort is normal.  Abdominal:     General: Abdomen is flat.     Palpations: Abdomen is soft. There is no hepatomegaly or  splenomegaly.     Tenderness: There is no abdominal tenderness.  Skin:    General: Skin is warm and dry.  Neurological:     Mental Status: She is alert and oriented to person, place, and time. Mental status is at baseline.  Psychiatric:        Mood and Affect: Mood normal.        Behavior: Behavior normal.        Thought Content: Thought content normal.        Judgment: Judgment normal.       Results:   Studies obtained and personally reviewed by me:  Her coronary calcium score is 0. On CT chest she was found to have a 1.9 cm hypodense lesion in the right lobe of the liver.  Labs:       Component Value Date/Time   NA 141 07/22/2022 0934   K 5.2 07/22/2022 0934   CL 105 07/22/2022 0934   CO2 31 07/22/2022 0934   GLUCOSE 82 07/22/2022 0934   BUN 15 07/22/2022 0934   CREATININE 0.73 07/22/2022 0934   CALCIUM 10.0 07/22/2022 0934   PROT 6.4 07/22/2022 0934   AST 23 07/22/2022 0934  ALT 18 07/22/2022 0934   BILITOT 0.6 07/22/2022 0934   GFRNONAA 87 08/29/2019 1131   GFRAA 101 08/29/2019 1131     Lab Results  Component Value Date   WBC 3.5 (L) 07/22/2022   HGB 14.2 07/22/2022   HCT 43.4 07/22/2022   MCV 95.4 07/22/2022   PLT 213 07/22/2022    Lab Results  Component Value Date   CHOL 211 (H) 12/08/2022   HDL 78 12/08/2022   LDLCALC 118 (H) 12/08/2022   TRIG 62 12/08/2022   CHOLHDL 2.7 12/08/2022    Lab Results  Component Value Date   HGBA1C 5.5 12/08/2022     Lab Results  Component Value Date   TSH 3.98 12/08/2022      Assessment & Plan:   1.9 cm hypodense lesion right lobe of the liver: ordered US abdomen RUQ. Will contact with results.  Coronary calcium scoring showed excellent score of 0  Addendum: Ultrasound shows 2.0 cm echogenic lesion right hepatic lobe indeterminate in etiology. MRI recommended.  I sent this report through [facility] to Dr. Adela Lank, patient's GI physician. He has reviewed and suggests MRI with [facility].  I have placed order for  this test with contrast as suggested. Patient is aware MRI is next step.  I,Teresa Simmons,acting as a Neurosurgeon for Margaree Mackintosh, MD.,have documented all relevant documentation on the behalf of Margaree Mackintosh, MD,as directed by  Margaree Mackintosh, MD while in the presence of Margaree Mackintosh, MD.  Time spent explaining results of Coronary CT with liver findings to patient, ordering ulresound of liver and contact Dr. Lanetta Inch office as well as responding to his suggestion for MRI with [facility] by placing order and contacting Radiology is 35 minutes.  IMargaree Mackintosh, MD, have reviewed all documentation for this visit. The documentation on 02/21/23 for the exam, diagnosis, procedures, and orders are all accurate and complete.

## 2023-02-19 NOTE — Progress Notes (Deleted)
   Subjective:    Patient ID: Teresa Simmons, female    DOB: 09-Mar-1956, 67 y.o.   MRN: [id number]  HPI    Review of Systems     Objective:   Physical Exam        Assessment & Plan:

## 2023-02-20 ENCOUNTER — Ambulatory Visit (HOSPITAL_BASED_OUTPATIENT_CLINIC_OR_DEPARTMENT_OTHER)
Admission: RE | Admit: 2023-02-20 | Discharge: 2023-02-20 | Disposition: A | Discharge status: Home / Self Care | Payer: Medicare HMO | Source: Ambulatory Visit | Attending: Internal Medicine | Admitting: Internal Medicine

## 2023-02-20 DIAGNOSIS — K769 Liver disease, unspecified: Secondary | ICD-10-CM | POA: Diagnosis not present

## 2023-02-27 ENCOUNTER — Ambulatory Visit (HOSPITAL_BASED_OUTPATIENT_CLINIC_OR_DEPARTMENT_OTHER): Payer: Medicare HMO

## 2023-02-27 ENCOUNTER — Ambulatory Visit (HOSPITAL_BASED_OUTPATIENT_CLINIC_OR_DEPARTMENT_OTHER)
Admission: RE | Admit: 2023-02-27 | Discharge: 2023-02-27 | Disposition: A | Discharge status: Home / Self Care | Payer: Medicare HMO | Source: Ambulatory Visit | Attending: Internal Medicine | Admitting: Internal Medicine

## 2023-02-27 DIAGNOSIS — M47816 Spondylosis without myelopathy or radiculopathy, lumbar region: Secondary | ICD-10-CM | POA: Insufficient documentation

## 2023-02-27 DIAGNOSIS — K769 Liver disease, unspecified: Secondary | ICD-10-CM | POA: Insufficient documentation

## 2023-02-27 DIAGNOSIS — M5136 Other intervertebral disc degeneration, lumbar region: Secondary | ICD-10-CM | POA: Insufficient documentation

## 2023-02-27 DIAGNOSIS — N281 Cyst of kidney, acquired: Secondary | ICD-10-CM | POA: Diagnosis not present

## 2023-02-27 MED ORDER — GADOXETATE DISODIUM 0.25 MMOL/ML IV SOLN
6.0000 mL | Freq: Once | INTRAVENOUS | Status: AC | PRN
  Administered 2023-02-27: 6 mL via INTRAVENOUS
  Filled 2023-02-27: qty 6

## 2023-03-02 ENCOUNTER — Encounter: Payer: Self-pay | Admitting: Internal Medicine

## 2023-03-06 NOTE — Patient Instructions (Signed)
Patient worried about 1.9 cm lesion in right lobe of liver recently seen on CT coronary calcium scoring study. Dr Adela Lank has provided guidance about what test to order to further determine what the lesion is. This will be ordered and scheduled as soon as possible.Coronary CT score is excellent at 0. Have spoken with patient in detail about this report.

## 2023-04-04 ENCOUNTER — Other Ambulatory Visit: Payer: Medicare HMO

## 2023-04-16 DIAGNOSIS — H269 Unspecified cataract: Secondary | ICD-10-CM | POA: Diagnosis not present

## 2023-04-16 DIAGNOSIS — Z85828 Personal history of other malignant neoplasm of skin: Secondary | ICD-10-CM | POA: Diagnosis not present

## 2023-04-16 DIAGNOSIS — N189 Chronic kidney disease, unspecified: Secondary | ICD-10-CM | POA: Diagnosis not present

## 2023-04-16 DIAGNOSIS — L309 Dermatitis, unspecified: Secondary | ICD-10-CM | POA: Diagnosis not present

## 2023-04-16 DIAGNOSIS — Z818 Family history of other mental and behavioral disorders: Secondary | ICD-10-CM | POA: Diagnosis not present

## 2023-04-16 DIAGNOSIS — M8580 Other specified disorders of bone density and structure, unspecified site: Secondary | ICD-10-CM | POA: Diagnosis not present

## 2023-04-16 DIAGNOSIS — Z8249 Family history of ischemic heart disease and other diseases of the circulatory system: Secondary | ICD-10-CM | POA: Diagnosis not present

## 2023-04-16 DIAGNOSIS — Z809 Family history of malignant neoplasm, unspecified: Secondary | ICD-10-CM | POA: Diagnosis not present

## 2023-06-15 ENCOUNTER — Telehealth: Payer: Self-pay | Admitting: Internal Medicine

## 2023-06-15 NOTE — Telephone Encounter (Signed)
Called patient and scheduled CPE labs

## 2023-06-15 NOTE — Telephone Encounter (Signed)
Copied from CRM [id number]. Topic: Clinical - Request for Lab/Test Order >> Jun 15, 2023  2:05 PM Teresa Simmons wrote: Reason for CRM: patient requesting to reschedule her lab visit close to September 11 2023 , she have a medicare wellness visit on 09/11/23 and want the labs before the visit and close to the visit

## 2023-06-18 DIAGNOSIS — R69 Illness, unspecified: Secondary | ICD-10-CM | POA: Diagnosis not present

## 2023-07-16 DIAGNOSIS — J019 Acute sinusitis, unspecified: Secondary | ICD-10-CM | POA: Diagnosis not present

## 2023-07-27 ENCOUNTER — Other Ambulatory Visit: Payer: Medicare HMO

## 2023-07-28 ENCOUNTER — Ambulatory Visit: Payer: Medicare HMO | Admitting: Internal Medicine

## 2023-08-19 DIAGNOSIS — Z1231 Encounter for screening mammogram for malignant neoplasm of breast: Secondary | ICD-10-CM | POA: Diagnosis not present

## 2023-08-19 DIAGNOSIS — Z6823 Body mass index (BMI) 23.0-23.9, adult: Secondary | ICD-10-CM | POA: Diagnosis not present

## 2023-08-19 DIAGNOSIS — Z124 Encounter for screening for malignant neoplasm of cervix: Secondary | ICD-10-CM | POA: Diagnosis not present

## 2023-09-08 ENCOUNTER — Other Ambulatory Visit: Payer: No Typology Code available for payment source

## 2023-09-08 ENCOUNTER — Encounter: Payer: Self-pay | Admitting: Internal Medicine

## 2023-09-08 DIAGNOSIS — E611 Iron deficiency: Secondary | ICD-10-CM

## 2023-09-08 DIAGNOSIS — E7800 Pure hypercholesterolemia, unspecified: Secondary | ICD-10-CM

## 2023-09-08 DIAGNOSIS — Z0000 Encounter for general adult medical examination without abnormal findings: Secondary | ICD-10-CM

## 2023-09-08 DIAGNOSIS — R7989 Other specified abnormal findings of blood chemistry: Secondary | ICD-10-CM | POA: Diagnosis not present

## 2023-09-09 DIAGNOSIS — I788 Other diseases of capillaries: Secondary | ICD-10-CM | POA: Diagnosis not present

## 2023-09-09 DIAGNOSIS — Z85828 Personal history of other malignant neoplasm of skin: Secondary | ICD-10-CM | POA: Diagnosis not present

## 2023-09-09 DIAGNOSIS — R208 Other disturbances of skin sensation: Secondary | ICD-10-CM | POA: Diagnosis not present

## 2023-09-09 DIAGNOSIS — L821 Other seborrheic keratosis: Secondary | ICD-10-CM | POA: Diagnosis not present

## 2023-09-09 DIAGNOSIS — L570 Actinic keratosis: Secondary | ICD-10-CM | POA: Diagnosis not present

## 2023-09-09 DIAGNOSIS — L820 Inflamed seborrheic keratosis: Secondary | ICD-10-CM | POA: Diagnosis not present

## 2023-09-09 DIAGNOSIS — D225 Melanocytic nevi of trunk: Secondary | ICD-10-CM | POA: Diagnosis not present

## 2023-09-09 DIAGNOSIS — L814 Other melanin hyperpigmentation: Secondary | ICD-10-CM | POA: Diagnosis not present

## 2023-09-09 DIAGNOSIS — L918 Other hypertrophic disorders of the skin: Secondary | ICD-10-CM | POA: Diagnosis not present

## 2023-09-09 LAB — CBC WITH DIFFERENTIAL/PLATELET
Absolute Lymphocytes: 1488 {cells}/uL (ref 850–3900)
Absolute Monocytes: 340 {cells}/uL (ref 200–950)
Basophils Absolute: 21 {cells}/uL (ref 0–200)
Basophils Relative: 0.6 %
Eosinophils Absolute: 109 {cells}/uL (ref 15–500)
Eosinophils Relative: 3.1 %
HCT: 42.7 % (ref 35.0–45.0)
Hemoglobin: 13.7 g/dL (ref 11.7–15.5)
MCH: 31 pg (ref 27.0–33.0)
MCHC: 32.1 g/dL (ref 32.0–36.0)
MCV: 96.6 fL (ref 80.0–100.0)
MPV: 11 fL (ref 7.5–12.5)
Monocytes Relative: 9.7 %
Neutro Abs: 1544 {cells}/uL (ref 1500–7800)
Neutrophils Relative %: 44.1 %
Platelets: 213 10*3/uL (ref 140–400)
RBC: 4.42 10*6/uL (ref 3.80–5.10)
RDW: 12 % (ref 11.0–15.0)
Total Lymphocyte: 42.5 %
WBC: 3.5 10*3/uL — ABNORMAL LOW (ref 3.8–10.8)

## 2023-09-09 LAB — COMPLETE METABOLIC PANEL WITH GFR
AG Ratio: 2.1 (calc) (ref 1.0–2.5)
ALT: 12 U/L (ref 6–29)
AST: 21 U/L (ref 10–35)
Albumin: 4.2 g/dL (ref 3.6–5.1)
Alkaline phosphatase (APISO): 62 U/L (ref 37–153)
BUN: 16 mg/dL (ref 7–25)
CO2: 33 mmol/L — ABNORMAL HIGH (ref 20–32)
Calcium: 9.6 mg/dL (ref 8.6–10.4)
Chloride: 107 mmol/L (ref 98–110)
Creat: 0.71 mg/dL (ref 0.50–1.05)
Globulin: 2 g/dL (ref 1.9–3.7)
Glucose, Bld: 79 mg/dL (ref 65–99)
Potassium: 4.7 mmol/L (ref 3.5–5.3)
Sodium: 144 mmol/L (ref 135–146)
Total Bilirubin: 0.5 mg/dL (ref 0.2–1.2)
Total Protein: 6.2 g/dL (ref 6.1–8.1)
eGFR: 93 mL/min/{1.73_m2} (ref >=60)

## 2023-09-09 LAB — LIPID PANEL
Cholesterol: 212 mg/dL — ABNORMAL HIGH (ref <200)
HDL: 82 mg/dL (ref >=50)
LDL Cholesterol (Calc): 116 mg/dL — ABNORMAL HIGH
Non-HDL Cholesterol (Calc): 130 mg/dL — ABNORMAL HIGH (ref <130)
Total CHOL/HDL Ratio: 2.6 (calc) (ref <5.0)
Triglycerides: 58 mg/dL (ref <150)

## 2023-09-09 LAB — TSH: TSH: 4.5 m[IU]/L (ref 0.40–4.50)

## 2023-09-11 ENCOUNTER — Ambulatory Visit: Payer: Medicare HMO | Admitting: Internal Medicine

## 2023-09-11 NOTE — Progress Notes (Signed)
 Annual Medicare Wellness Visit   Patient Care Team: Sylvan Evener, MD as PCP - General (Internal Medicine)  Visit Date: 10/20/23   Chief Complaint  Patient presents with   Annual Exam   Subjective:  Patient: Teresa Simmons, Female DOB: [birth year], 68 y.o. MRN: [id number] Teresa Simmons is a 68 y.o. Female who presents today for her Annual Medicare Wellness Visit. Patient has history of mild Iron Deficiency and Elevated TSH. Initially was referred by Archie Koch to this office and was seen for the first time in February 2021. Had Welcome to Medicare exam in November 2022.  Says that she has been having pain when bending her right knee, but otherwise doesn't have pain or limited ROM.    History of Elevated TSH, values have usually been around 3.2 - 3.9, with the highest value being 5.24 in 07/2022 and 2nd highest 4.73 in 05/2021. Has declined thyroid  replacement medication in the past. 09/08/2023 TSH: 4.50.    History of mild Iron Deficiency. Prior trends for Hemoglobin have been usually within range, with the lowest being 11.3 in 2017/11/28 and highest 14.2 in 11/29/2022; MCV has remained within range with the lowest beng 83.6 in Nov 28, 2017 and the highest 95.4 in Nov 29, 2022; Iron has been within normal range with the lowest at 59 in 11/28/20, highest 117 in 11/28/21; %SAT was 15 in 11-28-2020, highest was 31 in 08/2021; Ferritin in 2020/11/28 was 6, increased to 9 in 08/2021, and then 26 in June. 09/08/2023 CBC, compared to 07/22/2022: WBC 3.5, no change and only slightly below normal limits; otherwise WNL.   Coronary Cardiac Score 0 in 02/2023. Discussed 08/11/2023 Lipid Panel, compared to 07/22/2022: Cholesterol 212, decreased from 221, though still elevated; LDL 116, decreased from 125, though still elevated. She says she would prefer to manage this through diet and exercise.   Takes Calcium PO, Vitamin-D PO, and a Multi-Vitamins.  Labs 09/08/2023 CMP, compared to 07/22/2022: CO2 33, elevated from 31; otherwise WNL.   Pap Smear  08/22/2023  - pelvic exam deferred.  Has GYN physician (Dr. Serge Dancer) and gets mammograms through that office, which she had last done on 08/19/2023   Colonoscopy by 09/12/2021 by Dr. General Kenner was normal with recommended repeat 11-29-31.  Bone Density 07/30/2022 through her gynecologist's office. History of Mild Osteopenia on a DEXA scan through GYN's office in 11-29-15.   Vaccine Counseling: Due for Covid-19 and Tdap - declines Covid-19; UTD on Flu, Shingles 2/2, and PNA. No past medical history on file. No history of accidents or serious illnesses.  No known drug allergies.  1963 - Tonsillectomy at age 81.   Family History  Problem Relation Age of Onset   Cancer Mother    Lung cancer Mother    Cancer Father    Prostate cancer Father    Asthma Brother    Colon cancer Neg Hx    Colon polyps Neg Hx    Esophageal cancer Neg Hx    Stomach cancer Neg Hx    Rectal cancer Neg Hx   Family History Narrative: Father, deceased in 11/29/2022 at age 57, with hx of Brain Cancer & Dementia.  Mother died at age 11 of Lung Cancer w/ hx of smoking.   2 Brothers - 1 is a recovering alcoholic. Another brother is healthy as far she knows.   1 Sister is healthy as far she knows.   2 Sons that are healthy. Social History   Social History Narrative   Completed 4 years of  college. Works for [employer] for [employer]  Married - husband is employed in Product manager. Non-smoker. Social alcohol consumption. Exercises several times weekly. 2 pregnancies and no miscarriages.       Review of Systems  Constitutional:  Negative for chills, fever, malaise/fatigue and weight loss.  HENT:  Negative for hearing loss, sinus pain and sore throat.   Respiratory:  Negative for cough, hemoptysis and shortness of breath.   Cardiovascular:  Negative for chest pain, palpitations, leg swelling and PND.  Gastrointestinal:  Negative for abdominal pain, constipation, diarrhea, heartburn, nausea and vomiting.   Genitourinary:  Negative for dysuria, frequency and urgency.  Musculoskeletal:  Negative for back pain, myalgias and neck pain.  Skin:  Negative for itching and rash.  Neurological:  Negative for dizziness, tingling, seizures and headaches.  Endo/Heme/Allergies:  Negative for polydipsia.  Psychiatric/Behavioral:  Negative for depression. The patient is not nervous/anxious.     Objective:  Vitals: BP 122/78   Pulse 70   Temp 99.3 F (37.4 C) (Temporal)   Ht 5\' 5"  (1.651 m)   Wt 139 lb 6.4 oz (63.2 kg)   SpO2 94%   BMI 23.20 kg/m  Physical Exam Vitals and nursing note reviewed.  Constitutional:      General: She is not in acute distress.    Appearance: Normal appearance. She is not ill-appearing or toxic-appearing.  HENT:     Head: Normocephalic and atraumatic.     Right Ear: Hearing, tympanic membrane, ear canal and external ear normal.     Left Ear: Hearing, tympanic membrane, ear canal and external ear normal.     Mouth/Throat:     Pharynx: Oropharynx is clear.  Eyes:     Extraocular Movements: Extraocular movements intact.     Pupils: Pupils are equal, round, and reactive to light.  Neck:     Thyroid : No thyroid  mass, thyromegaly or thyroid  tenderness.     Vascular: No carotid bruit.  Cardiovascular:     Rate and Rhythm: Normal rate and regular rhythm. No extrasystoles are present.    Pulses:          Dorsalis pedis pulses are 2+ on the right side and 2+ on the left side.     Heart sounds: Normal heart sounds. No murmur heard.    No friction rub. No gallop.  Pulmonary:     Effort: Pulmonary effort is normal.     Breath sounds: Normal breath sounds. No decreased breath sounds, wheezing, rhonchi or rales.  Chest:     Chest wall: No mass.  Abdominal:     Palpations: Abdomen is soft. There is no hepatomegaly, splenomegaly or mass.     Tenderness: There is no abdominal tenderness.     Hernia: No hernia is present.  Musculoskeletal:     Cervical back: Normal range of  motion.     Right knee: Crepitus (back of knee) present.     Right lower leg: No edema.     Left lower leg: No edema.  Lymphadenopathy:     Cervical: No cervical adenopathy.     Upper Body:     Right upper body: No supraclavicular adenopathy.     Left upper body: No supraclavicular adenopathy.  Skin:    General: Skin is warm and dry.  Neurological:     General: No focal deficit present.     Mental Status: She is alert and oriented to person, place, and time. Mental status is at baseline.  Sensory: Sensation is intact.     Motor: Motor function is intact. No weakness.     Deep Tendon Reflexes: Reflexes are normal and symmetric.  Psychiatric:        Attention and Perception: Attention normal.        Mood and Affect: Mood normal.        Speech: Speech normal.        Behavior: Behavior normal.        Thought Content: Thought content normal.        Cognition and Memory: Cognition normal.        Judgment: Judgment normal.   Most Recent Functional Status Assessment:    10/20/2023   11:19 AM  In your present state of health, do you have any difficulty performing the following activities:  Hearing? 0  Vision? 0  Difficulty concentrating or making decisions? 0  Walking or climbing stairs? 0  Dressing or bathing? 0  Doing errands, shopping? 0  Preparing Food and eating ? N  Using the Toilet? N  In the past six months, have you accidently leaked urine? N  Do you have problems with loss of bowel control? N  Managing your Medications? N  Managing your Finances? N  Housekeeping or managing your Housekeeping? N   Most Recent Fall Risk Assessment:    10/20/2023   11:13 AM  Fall Risk   Falls in the past year? 0  Number falls in past yr: 0  Injury with Fall? 0  Risk for fall due to : No Fall Risks  Follow up Falls evaluation completed   Most Recent Depression Screenings:    10/20/2023   11:13 AM 07/25/2022    3:13 PM  PHQ 2/9 Scores  PHQ - 2 Score 0 0   Most Recent  Cognitive Screening:    10/20/2023   11:23 AM  6CIT Screen  What Year? 4 points  What month? 3 points  What time? 3 points  Count back from 20 4 points  Months in reverse 4 points  Repeat phrase 10 points  Total Score 28 points   Results:  Studies Obtained And Personally Reviewed By Me:  Colonoscopy by Dr. Robinette Chou in 2009 w/ repeat study in 2023 which was normal.  Bone Density 07/07/2018. History of DEXA scan in GYN office May 2017 showing mild osteopenia.  EXAM: Coronary Calcium Score 02/09/2023 15:15  FINDINGS: Coronary Calcium Score:   Left main: 0   Left anterior descending artery: 0   Left circumflex artery: 0   Right coronary artery: 0   Total: 0   Percentile: 0   Pericardium: Normal.   Ascending Aorta: Normal caliber.   IMPRESSION: Coronary calcium score of 0. This was 0 percentile for age-, race-, and sex-matched controls.  Non-cardiac FINDINGS Cardiovascular: There are no significant extracardiac vascular findings.   Mediastinum/Nodes: There are no enlarged lymph nodes within the visualized mediastinum.   Lungs/Pleura: There is no pleural effusion. Mild scarring is noted in the left lung base posteriorly.   Upper abdomen: There is a 1.9 cm hypodense lesion identified in the right lobe of the liver best seen on image number 68 of series 4. This is incompletely characterized on this noncontrast study   Musculoskeletal/Chest wall: No chest wall mass or suspicious osseous findings within the visualized chest.   IMPRESSION: 1.9 cm hypodense lesion in the right lobe of the liver. This is incompletely characterized on this exam. Ultrasound would be an appropriate initial screening technique.   No  other focal extra cardiac abnormality is noted.   Labs:     Component Value Date/Time   NA 144 09/08/2023 0921   K 4.7 09/08/2023 0921   CL 107 09/08/2023 0921   CO2 33 (H) 09/08/2023 0921   GLUCOSE 79 09/08/2023 0921   BUN 16 09/08/2023 0921    CREATININE 0.71 09/08/2023 0921   CALCIUM 9.6 09/08/2023 0921   PROT 6.2 09/08/2023 0921   AST 21 09/08/2023 0921   ALT 12 09/08/2023 0921   BILITOT 0.5 09/08/2023 0921   GFRNONAA 87 08/29/2019 1131   GFRAA 101 08/29/2019 1131    Lab Results  Component Value Date   WBC 3.5 (L) 09/08/2023   HGB 13.7 09/08/2023   HCT 42.7 09/08/2023   MCV 96.6 09/08/2023   PLT 213 09/08/2023   Lab Results  Component Value Date   CHOL 212 (H) 09/08/2023   HDL 82 09/08/2023   LDLCALC 116 (H) 09/08/2023   TRIG 58 09/08/2023   CHOLHDL 2.6 09/08/2023   Lab Results  Component Value Date   HGBA1C 5.5 12/08/2022    Lab Results  Component Value Date   TSH 4.50 09/08/2023    Assessment & Plan:   Orders Placed This Encounter  Procedures   POCT URINALYSIS DIP (CLINITEK)  Other Labs Reviewed today: CMP, compared to 07/22/2022: CO2 33, elevated from 31; otherwise WNL.   Knee Pain, Right: has been having pain when bending her right knee, but otherwise doesn't have pain or limited ROM. Discussed injections, she will let us  know what she would like to do about this in the future.   Elevated TSH, values have usually been around 3.2 - 3.9, with the highest value being 5.24 in 07/2022 and 2nd highest 4.73 in 05/2021. Has declined thyroid  replacement medication in the past. 09/08/2023 TSH: 4.50.    Iron Deficiency. Prior trends for Hemoglobin have been usually within range, with the lowest being 11.3 in 2019 and highest 14.2 in 2024; MCV has remained within range with the lowest beng 83.6 in 2019 and the highest 95.4 in 2024; Iron has been within normal range with the lowest at 59 in 2022, highest 117 in 2023; %SAT was 15 in 2022, highest was 31 in 08/2021; Ferritin in 2022 was 6, increased to 9 in 08/2021, and then 26 in June. 09/08/2023 CBC, compared to 07/22/2022: WBC 3.5, no change and only slightly below normal limits; otherwise WNL.   Elevated Cholesterol: Coronary Cardiac Score 0 in 02/2023. Discussed 08/11/2023  Lipid Panel, compared to 07/22/2022: Cholesterol 212, decreased from 221, though still elevated; LDL 116, decreased from 125, though still elevated. She says she would prefer to manage this through diet and exercise.   Pap Smear 08/22/2023  - pelvic exam deferred.  Has GYN physician (Dr. Serge Dancer) and gets Mammograms through that office, which she had last done on 08/19/2023   Colonoscopy by 09/12/2021 by Dr. General Kenner was normal with recommended repeat 2033.  Bone Density 07/30/2022 through her gynecologist's office. History of Mild Osteopenia on a DEXA scan through GYN's office in May 2017.   Vaccine Counseling: Due for Covid-19 and Tdap - declines Covid-19; UTD on Flu, Shingles 2/2, and PNA.  Return in about 6 months (around 04/20/2024) for Lab Visit to Recheck TSH.   Annual wellness visit done today including the all of the following: Reviewed patient's Family Medical History Reviewed and updated list of patient's medical providers Assessment of cognitive impairment was done Assessed patient's functional ability Established a written schedule  for health screening services Health Risk Assessent Completed and Reviewed  Discussed health benefits of physical activity, and encouraged her to engage in regular exercise appropriate for her age and condition.    I,Emily Lagle,acting as a Neurosurgeon for Sylvan Evener, MD.,have documented all relevant documentation on the behalf of Sylvan Evener, MD,as directed by  Sylvan Evener, MD while in the presence of Sylvan Evener, MD.   I, Sylvan Evener, MD, have reviewed all documentation for this visit. The documentation on 11/01/23 for the exam, diagnosis, procedures, and orders are all accurate and complete.

## 2023-10-20 ENCOUNTER — Ambulatory Visit: Admitting: Internal Medicine

## 2023-10-20 VITALS — BP 122/78 | HR 70 | Temp 99.3°F | Ht 65.0 in | Wt 139.4 lb

## 2023-10-20 DIAGNOSIS — M25561 Pain in right knee: Secondary | ICD-10-CM

## 2023-10-20 DIAGNOSIS — R7989 Other specified abnormal findings of blood chemistry: Secondary | ICD-10-CM

## 2023-10-20 DIAGNOSIS — Z0000 Encounter for general adult medical examination without abnormal findings: Secondary | ICD-10-CM

## 2023-10-20 DIAGNOSIS — G8929 Other chronic pain: Secondary | ICD-10-CM

## 2023-10-20 DIAGNOSIS — E7800 Pure hypercholesterolemia, unspecified: Secondary | ICD-10-CM | POA: Diagnosis not present

## 2023-10-20 LAB — POCT URINALYSIS DIP (CLINITEK)
Bilirubin, UA: NEGATIVE
Blood, UA: NEGATIVE
Glucose, UA: NEGATIVE mg/dL
Ketones, POC UA: NEGATIVE mg/dL
Leukocytes, UA: NEGATIVE
Nitrite, UA: NEGATIVE
Spec Grav, UA: 1.02 (ref 1.010–1.025)
Urobilinogen, UA: 0.2 U/dL
pH, UA: 6 (ref 5.0–8.0)

## 2023-11-01 ENCOUNTER — Encounter: Payer: Self-pay | Admitting: Internal Medicine

## 2023-11-01 NOTE — Patient Instructions (Addendum)
 Please call for mammogram appointment. Continue current medications. Return in one year or as needed.

## 2023-11-23 DIAGNOSIS — H2513 Age-related nuclear cataract, bilateral: Secondary | ICD-10-CM | POA: Diagnosis not present

## 2023-12-23 DIAGNOSIS — M1711 Unilateral primary osteoarthritis, right knee: Secondary | ICD-10-CM | POA: Diagnosis not present

## 2024-02-02 DIAGNOSIS — M25561 Pain in right knee: Secondary | ICD-10-CM | POA: Diagnosis not present

## 2024-02-02 DIAGNOSIS — Z008 Encounter for other general examination: Secondary | ICD-10-CM | POA: Diagnosis not present

## 2024-02-02 DIAGNOSIS — S83241A Other tear of medial meniscus, current injury, right knee, initial encounter: Secondary | ICD-10-CM | POA: Diagnosis not present

## 2024-02-02 DIAGNOSIS — M1711 Unilateral primary osteoarthritis, right knee: Secondary | ICD-10-CM | POA: Diagnosis not present

## 2024-03-08 DIAGNOSIS — M1711 Unilateral primary osteoarthritis, right knee: Secondary | ICD-10-CM | POA: Diagnosis not present

## 2024-03-08 DIAGNOSIS — S83281A Other tear of lateral meniscus, current injury, right knee, initial encounter: Secondary | ICD-10-CM | POA: Diagnosis not present

## 2024-03-14 DIAGNOSIS — M25561 Pain in right knee: Secondary | ICD-10-CM | POA: Diagnosis not present

## 2024-04-21 ENCOUNTER — Other Ambulatory Visit

## 2024-04-26 ENCOUNTER — Other Ambulatory Visit

## 2024-04-26 DIAGNOSIS — R7989 Other specified abnormal findings of blood chemistry: Secondary | ICD-10-CM

## 2024-04-26 LAB — TSH: TSH: 4.43 m[IU]/L (ref 0.40–4.50)
